# Patient Record
Sex: Female | Born: 1945 | Race: White | Hispanic: No | Marital: Single | Smoking: Never smoker
Health system: Southern US, Community
[De-identification: ages and names within clinical notes are randomized; demographics above are authoritative.]

## PROBLEM LIST (undated history)

## (undated) DIAGNOSIS — M1990 Unspecified osteoarthritis, unspecified site: Secondary | ICD-10-CM

## (undated) DIAGNOSIS — Z9889 Other specified postprocedural states: Secondary | ICD-10-CM

## (undated) DIAGNOSIS — R112 Nausea with vomiting, unspecified: Secondary | ICD-10-CM

## (undated) HISTORY — PX: TONSILLECTOMY: SUR1361

---

## 1983-05-22 HISTORY — PX: DILATION AND CURETTAGE OF UTERUS: SHX78

## 1990-05-21 HISTORY — PX: ABDOMINAL HYSTERECTOMY: SHX81

## 1994-05-21 DIAGNOSIS — C4491 Basal cell carcinoma of skin, unspecified: Secondary | ICD-10-CM

## 1994-05-21 HISTORY — DX: Basal cell carcinoma of skin, unspecified: C44.91

## 1997-11-30 ENCOUNTER — Other Ambulatory Visit: Admission: RE | Admit: 1997-11-30 | Discharge: 1997-11-30 | Payer: Self-pay | Admitting: Obstetrics & Gynecology

## 1999-05-17 ENCOUNTER — Other Ambulatory Visit: Admission: RE | Admit: 1999-05-17 | Discharge: 1999-05-17 | Payer: Self-pay | Admitting: Obstetrics & Gynecology

## 2000-08-14 ENCOUNTER — Other Ambulatory Visit: Admission: RE | Admit: 2000-08-14 | Discharge: 2000-08-14 | Payer: Self-pay | Admitting: Obstetrics & Gynecology

## 2001-12-17 ENCOUNTER — Other Ambulatory Visit: Admission: RE | Admit: 2001-12-17 | Discharge: 2001-12-17 | Payer: Self-pay | Admitting: Obstetrics & Gynecology

## 2003-05-03 ENCOUNTER — Other Ambulatory Visit: Admission: RE | Admit: 2003-05-03 | Discharge: 2003-05-03 | Payer: Self-pay | Admitting: Obstetrics & Gynecology

## 2005-02-01 ENCOUNTER — Other Ambulatory Visit: Admission: RE | Admit: 2005-02-01 | Discharge: 2005-02-01 | Payer: Self-pay | Admitting: Obstetrics & Gynecology

## 2005-02-15 ENCOUNTER — Encounter: Admission: RE | Admit: 2005-02-15 | Discharge: 2005-02-15 | Payer: Self-pay | Admitting: Obstetrics & Gynecology

## 2005-09-11 ENCOUNTER — Encounter: Admission: RE | Admit: 2005-09-11 | Discharge: 2005-09-11 | Payer: Self-pay | Admitting: Obstetrics & Gynecology

## 2006-11-19 HISTORY — PX: JOINT REPLACEMENT: SHX530

## 2006-12-03 ENCOUNTER — Ambulatory Visit (HOSPITAL_COMMUNITY): Admission: RE | Admit: 2006-12-03 | Discharge: 2006-12-04 | Payer: Self-pay | Admitting: Orthopedic Surgery

## 2007-10-23 ENCOUNTER — Ambulatory Visit: Payer: Self-pay | Admitting: Internal Medicine

## 2007-11-07 ENCOUNTER — Ambulatory Visit: Payer: Self-pay | Admitting: Internal Medicine

## 2010-10-03 NOTE — H&P (Signed)
[name], [name]               ACCOUNT NO.:  [id number]   MEDICAL RECORD NO.:  [id number]        PATIENT TYPE:  LINP   LOCATION:                               FACILITY:  [facility]   PHYSICIAN:  Madlyn Frankel. Charlann Boxer, M.D.  DATE OF BIRTH:  20-Nov-1945   DATE OF ADMISSION:  12/03/2006  DATE OF DISCHARGE:                              HISTORY & PHYSICAL   ATTENDING PHYSICIAN:  Durene Romans, M.D.   PROCEDURE TO BE PERFORMED:  Right unicondylar knee arthroplasty.   CHIEF COMPLAINT:  Right knee pain.   HISTORY OF PRESENT ILLNESS:  This is a 65 year old female with a history  of persistent progressive knee pain mainly on the medial side that has  been refractory to all conservative treatments.  She has been  presurgically assessed by Dr. Windy Fast Deal.   PAST MEDICAL HISTORY:  Post menopausal, otherwise healthy.   FAMILY HISTORY:  Heart disease, diabetes, cancer, and arthritis.   SOCIAL HISTORY:  The patient is divorced, a Runner, broadcasting/film/video, primary caregiver  after surgery will be Dr. Delight Hoh and Jacqulyn Liner.   DRUG ALLERGIES:  No known drug allergies.   MEDICATIONS:  Premarin, unknown dosage or delivery route, vitamin D plus  calcium.   REVIEW OF SYSTEMS:  None other than HPI.   HISTORY OF PRESENT ILLNESS:  VITAL SIGNS:  Pulse 64, respirations 18,  and blood pressure 126/92.  GENERAL:  She is awake, alert and oriented, well-developed, well-  nourished, in no acute distress.  NECK:  Supple.  No carotid bruits.  CHEST/LUNGS:  Clear to auscultation bilaterally.  BREASTS:  Deferred.  HEART:  Regular rate and rhythm without gallops, clicks, rubs, or  murmurs.  ABDOMEN:  The abdomen is soft, nontender, nondistended.  Bowel sounds  are present in all four quadrants.  GENITOURINARY:  Deferred.  EXTREMITIES:  She has medial sided tenderness, stable in all four  quadrants, no significant deformity.  SKIN:  The skin is intact.  No scars, dorsalis pedis pulses are  positive.  NEUROLOGIC:   Intact distal sensibilities.   LABORATORY STUDIES:  EKG and chest x-ray are all pending.   IMPRESSION:  1. Osteoarthritis.  2. Post menopausal.   PLAN OF ACTION:  Right unicondylar knee replacement on 12/03/06 at  [facility] by Dr. Durene Romans.  Risks and complications were  discussed.  Questions were encouraged, answered, and reviewed.   We did discuss preoperative use of Celebrex and she was given a  prescription for Celebrex 200 mg which she will take two days before  surgery as well as on the morning of her surgery, dosage of one per day,  twice a day up to the surgery, and two on the morning of surgery.  We  also want to continue these two days postoperatively on postop day  number one and two as well.  Also the patient was given a prescription  for postoperative medications which included her blood thinning,  muscle  relaxers, as well as over-the-counter medications.     ______________________________  Yetta Glassman [address], [place]      Madlyn Frankel. Charlann Boxer, M.D.  Electronically  Signed    BLM/MEDQ  D:  11/21/2006  T:  11/22/2006  Job:  [id number]   cc:   Sharlet Salina [address]. [address], [place]   Madlyn Frankel. Charlann Boxer, M.D.  Fax: [phone]

## 2010-10-03 NOTE — Op Note (Signed)
NAMEJAPJI, KOK               ACCOUNT NO.:  [id number]   MEDICAL RECORD NO.:  [id number]          PATIENT TYPE:  INP   LOCATION:  0007                         FACILITY:  [facility]   PHYSICIAN:  Madlyn Frankel. Charlann Boxer, M.D.  DATE OF BIRTH:  [birth year]   DATE OF PROCEDURE:  12/03/2006  DATE OF DISCHARGE:                               OPERATIVE REPORT   PREOPERATIVE DIAGNOSIS:  Right knee medial compartment osteoarthritis.   POSTOPERATIVE DIAGNOSIS:  Right knee medial compartment osteoarthritis.   PROCEDURE:  Right unicompartmental or partial knee replacement using a  Biomet Oxford knee system small femur, size A right medial tibia and a  size 5 anatomic insert.   SURGEON:  Madlyn Frankel. Charlann Boxer, M.D.   ASSISTANT:  Dwyane Luo, PA-C   ANESTHESIA:  Duramorph spinal.   BLOOD LOSS:  Minimal.   TOURNIQUET TIME:  60 minutes at 250 mmHg.   INDICATIONS FOR PROCEDURE:  Ms. Leard is a 65 year old female who  presented to the office for evaluation of bilateral knee osteoarthritis.  Radiographically she had primary bone-on-bone medial compartment  osteoarthritis.  I reviewed with her the possible indications for total  versus unicompartmental knee replacement as she had failed conservative  measures and had persistent discomfort.   After reviewing the risks and benefits including the possible need for  revision surgery and patellofemoral arthritis, she wished proceed with a  partial knee replacement.  Radiographic analysis indicated that she had  an intact medial collateral ligament, intact anterior cruciate ligament  based on wear pattern.  Consent was obtained after reviewing the  standard risks of infection, DVT, component failure, need for revision  surgery.  Consent obtained.   PROCEDURE IN DETAIL:  The patient was brought to operative theater.  Once adequate anesthesia, preoperative antibiotics, Ancef, were  administered, the patient was positioned supine.  Proximal thigh  tourniquet was  placed, the Oxford leg holder was then positioned  appropriately to allow for 120 degrees of flexion.  The right lower  extremity was pre scrubbed and prepped and draped in sterile fashion.  A  paramidline incision was made from patella to the tubercle region.  The  sharp dissection was carried out and opened up soft tissue planes.  The  modified medial parapatellar arthrotomy was created to allow for patella  subluxation and synovial debridement carried out.   Attention was first directed debrided osteophytes.  This included into  the notch as well as the medial aspect of femur.  The patient noted to  have extensive medial compartment osteoarthritis with bone-on-bone  articulation.  Osteophytes were debrided along the entire medial extent  of the femur and tibia.   Evaluation of patella revealed the trochlear surface was relatively  intact.  There were some grade 2 changes noted on the patella  undersurface but no exposed or eburnated bone regions.   At this point we proceeded with the medial compartment arthroplasty.  The extramedullary guide was placed for tibial cut and after  appropriately positioned, was pinned into place.  Using a reciprocating  saw and made the initial cut towards the center of the hip  joint.  I  then used the oscillating saw to remove the wedge.  The patient's wear  pattern was noted to be perfect for this type of procedure with the  anterior medial pattern.  I sized this cut surface initially with a size  B tibia, however, once I removed some osteophytes the A fit better.   Once I had this A in position, I trialed with my feeler gauge and felt  that there was at least room for the size 4 with good fit.  This was  without retractors placed, so at this point the pins were removed and  attention directed to the femur  The starting awl was passed into the  femur through a starting hole placed a centimeter above the medial  aspect of the notch.     With the  intramedullary rod in the femur and a size 3 feeler gauge in  place, the guide for the posterior cutting jig was placed on the femur.  Once all parameters met and parallel and perpendicular I drilled these  holes.  The posterior femoral cutting block was then placed and the cut  made.  This matched perfectly with size small femur.   At this point I used a zero spigot milled the distal femur.   I then performed a trial with a small femur, A tibia and 4 mm insert and  the knee came out.  In flexion I felt that the 5 was a little bit better  in terms of stability but not excessively tight.  However, when I  brought the knee to 20 degrees of extension, the one feeler gauge felt  good, so I used a size 4 spigot.  The 4 spigot was then placed, milled,  excessive osteophytes and bone removed.  Once the debris was carried  out, I retrialed with a small femur, A tibia and a 5 insert.  I felt the  knee was very stable with a millimeter or two of play with the feeler  gauge in 90 degrees of flexion and 20 degrees of flexion.  This to me  equated to a balanced knee.  At this point all trial components removed.  I drilled the distal femur due to some sclerotic bone.  Final  preparation of the tibia was carried out using with the keeled  component.  Once I used a reciprocating saw to create the notch, I  trialed with a small femur and the keeled tibial component.  I used the  more conforming trial liner and then still felt the size 5 felt great in  90 degrees of flexion and 20 degrees of extension.  At this point all  trials were removed, the knee was irrigated, cement was mixed in two  batches, one for the tibia one for the femur.  Final components were  opened, tibia was cemented in place first with the trial femur placed,  the knee brought to 45 degrees of flexion.  Excessive cement was  removed.  The second batch cement was used for the femur and done in the  same way.  Once all the extruded  cement was removed and cured and I was  happy there was no remaining cement even in the posterior aspect of the  knee.  I injected the knee with 60 mL of 0.25% Marcaine with epinephrine  and Toradol.  The final 5 insert was then placed.  I injected FloSeal  into the starting hole of the femur as well as into  the synovial tissues  surrounding it.  At this point the extensor mechanism was reapproximated  using #1 Vicryl.  The remainder of wound was closed with 2-0 Vicryl and  running 4-0 Monocryl.  The knee was dressed with Steri-Strips and a  silver based dressing.  The patient was brought to recovery room in  stable condition.      Madlyn Frankel Charlann Boxer, M.D.  Electronically Signed     MDO/MEDQ  D:  12/03/2006  T:  12/04/2006  Job:  [id number]

## 2011-03-05 LAB — CBC
HCT: 31.8 — ABNORMAL LOW
Hemoglobin: 11.2 — ABNORMAL LOW
MCHC: 35.4
MCV: 82.4
Platelets: 253
RBC: 3.86 — ABNORMAL LOW
RDW: 12.9
WBC: 8

## 2011-03-05 LAB — TYPE AND SCREEN

## 2011-03-05 LAB — BASIC METABOLIC PANEL
CO2: 28
Creatinine, Ser: 0.53
Glucose, Bld: 120 — ABNORMAL HIGH

## 2011-03-05 LAB — ABO/RH: ABO/RH(D): O POS

## 2011-03-06 LAB — COMPREHENSIVE METABOLIC PANEL
AST: 21
Alkaline Phosphatase: 50
BUN: 15
Calcium: 9.3
Chloride: 108
Creatinine, Ser: 0.7
GFR calc Af Amer: >60
Glucose, Bld: 105 — ABNORMAL HIGH
Sodium: 142
Total Protein: 6.5

## 2011-03-06 LAB — URINALYSIS, ROUTINE W REFLEX MICROSCOPIC
Bilirubin Urine: NEGATIVE
Hgb urine dipstick: NEGATIVE
pH: 7

## 2011-03-06 LAB — CBC
MCHC: 34.6
RDW: 13.3

## 2011-11-02 ENCOUNTER — Encounter (HOSPITAL_COMMUNITY): Payer: Self-pay | Admitting: Pharmacy Technician

## 2011-11-04 NOTE — H&P (Signed)
[name] is an 66 y.o. female.    Chief Complaint: Left knee medial compartment OA and pain   HPI: Pt is a 66 y.o. female complaining of left knee pain for 7+ years.  Previous right knee medial unilateral knee replacement in 11/2006, she has been doing well with this surgery. Pain in the left knee has continually increased since the beginning, but he was trying to put it off as long as she could. It is now effecting her quality of life and wishes to have something done. X-rays in the clinic show end-stage arthritic changes of the medial compartment of the left knee. Pt has tried various conservative treatments which have failed to alleviate their symptoms, including NSAIDs, steroid injection, hyaluronic acid injections and braces. Various options are discussed with the patient. Risks, benefits and expectations were discussed with the patient. Patient understand the risks, benefits and expectations and wishes to proceed with surgery.   PCP:  No primary provider on file.  D/C Plans:  Home with HHPT  Post-op Meds:  Rx given for ASA, Robaxin, Celebrex, Iron, Colace and MiraLax  Tranexamic Acid:   To be given  Decadron:   To be given  PMH: IBS Arthritis  PSH: Right unilateral knee replacement - 2009 Hysterectomy - 1991  Social History:  Denies the use of tobacco Admits to daily use of wine  Allergies:  Allergies  Allergen Reactions  . Penicillins     REACTION: Extreme swelling at injection site    Medications: No current facility-administered medications for this encounter.   Current Outpatient Prescriptions  Medication Sig Dispense Refill  . calcium-vitamin D (OSCAL WITH D) 500-200 MG-UNIT per tablet Take 1 tablet by mouth daily.      . cetirizine (ZYRTEC) 10 MG tablet Take 10 mg by mouth daily.      . cholecalciferol (VITAMIN D) 1000 UNITS tablet Take 1,000 Units by mouth daily.      Marland Kitchen estrogens, conjugated, (PREMARIN) 0.45 MG tablet Take 0.45 mg by mouth daily. Take  daily for 21 days then do not take for 7 days.      . meloxicam (MOBIC) 7.5 MG tablet Take 7.5 mg by mouth daily.      . Multiple Vitamin (MULTIVITAMIN WITH MINERALS) TABS Take 1 tablet by mouth daily.        ROS: Review of Systems  Constitutional: Negative.   HENT: Negative.   Eyes: Negative.   Respiratory: Negative.   Cardiovascular: Negative.   Gastrointestinal: Negative.   Genitourinary: Negative.   Musculoskeletal: Positive for joint pain.  Skin: Negative.   Neurological: Negative.   Endo/Heme/Allergies: Negative.   Psychiatric/Behavioral: Negative.      Physical Exam: BP: 161/85 ; HR: 78 ; Resp: 16 Physical Exam  Constitutional: She is oriented to person, place, and time and well-developed, well-nourished, and in no distress.  HENT:  Head: Normocephalic and atraumatic.  Nose: Nose normal.  Mouth/Throat: Oropharynx is clear and moist.  Eyes: Pupils are equal, round, and reactive to light.  Neck: Neck supple. No JVD present. No tracheal deviation present. No thyromegaly present.  Cardiovascular: Normal rate, regular rhythm, normal heart sounds and intact distal pulses.   Pulmonary/Chest: Effort normal and breath sounds normal. No respiratory distress. She exhibits no tenderness.  Abdominal: Soft. There is no tenderness. There is no guarding.  Musculoskeletal:       Left knee: She exhibits decreased range of motion, swelling and bony tenderness (medial aspect of the knee only). She exhibits no effusion, no  ecchymosis and no laceration. tenderness found.  Lymphadenopathy:    She has no cervical adenopathy.  Neurological: She is alert and oriented to person, place, and time.  Skin: Skin is warm and dry.  Psychiatric: Affect normal.     Assessment/Plan Assessment:   Left knee medial compartment OA and pain    Plan: Patient will undergo a left medial unilateral knee arthroplasty  on 11/19/2011 per Dr. Charlann Boxer at [facility]. Risks benefits and expectation were  discussed with the patient. Patient understand risks, benefits and expectation and wishes to proceed.   Anastasio Auerbach Caisley Baxendale   PAC  11/04/2011, 4:01 PM

## 2011-11-06 ENCOUNTER — Encounter (HOSPITAL_COMMUNITY)
Admission: RE | Admit: 2011-11-06 | Discharge: 2011-11-06 | Disposition: A | Discharge status: Home / Self Care | Payer: Medicare Other | Source: Ambulatory Visit | Attending: Orthopedic Surgery | Admitting: Orthopedic Surgery

## 2011-11-06 ENCOUNTER — Encounter (HOSPITAL_COMMUNITY): Payer: Self-pay

## 2011-11-06 HISTORY — DX: Nausea with vomiting, unspecified: R11.2

## 2011-11-06 HISTORY — DX: Other specified postprocedural states: Z98.890

## 2011-11-06 HISTORY — DX: Unspecified osteoarthritis, unspecified site: M19.90

## 2011-11-06 LAB — BASIC METABOLIC PANEL
GFR calc non Af Amer: >90 mL/min (ref >90)
Glucose, Bld: 94 mg/dL (ref 70–99)
Potassium: 4.1 mEq/L (ref 3.5–5.1)
Sodium: 137 mEq/L (ref 135–145)

## 2011-11-06 LAB — URINE MICROSCOPIC-ADD ON

## 2011-11-06 LAB — URINALYSIS, ROUTINE W REFLEX MICROSCOPIC
Nitrite: NEGATIVE
Specific Gravity, Urine: 1.024 (ref 1.005–1.030)
Urobilinogen, UA: 0.2 mg/dL (ref 0.0–1.0)
pH: 5.5 (ref 5.0–8.0)

## 2011-11-06 LAB — DIFFERENTIAL
Eosinophils Absolute: 0.1 10*3/uL (ref 0.0–0.7)
Lymphocytes Relative: 28 % (ref 12–46)
Lymphs Abs: 1.5 10*3/uL (ref 0.7–4.0)
Monocytes Relative: 7 % (ref 3–12)
Neutro Abs: 3.4 10*3/uL (ref 1.7–7.7)
Neutrophils Relative %: 63 % (ref 43–77)

## 2011-11-06 LAB — CBC
Hemoglobin: 14.3 g/dL (ref 12.0–15.0)
MCH: 28.4 pg (ref 26.0–34.0)
RBC: 5.04 MIL/uL (ref 3.87–5.11)
WBC: 5.4 10*3/uL (ref 4.0–10.5)

## 2011-11-06 LAB — SURGICAL PCR SCREEN: MRSA, PCR: NEGATIVE

## 2011-11-06 LAB — PROTIME-INR: INR: 0.96 (ref 0.00–1.49)

## 2011-11-06 NOTE — Pre-Procedure Instructions (Signed)
Reviewed pre-op instructions with patient using Teach back method. 

## 2011-11-06 NOTE — Patient Instructions (Signed)
19 [name]  11/06/2011   Your procedure is scheduled on:  Monday 11/19/2011 at 0730am  Report to [facility] at 0530 AM.  Call this number if you have problems the morning of surgery: [phone]   Remember:   Do not eat food:After Midnight.  May have clear liquids:until Midnight .    Take these medicines the morning of surgery with A SIP OF WATER: Zyrtec   Do not wear jewelry, make-up or nail polish.  Do not wear lotions, powders, or perfumes.   Do not shave 48 hours prior to surgery. Men may shave face and neck.  Do not bring valuables to the hospital.  Contacts, dentures or bridgework may not be worn into surgery.  Leave suitcase in the car. After surgery it may be brought to your room.  For patients admitted to the hospital, checkout time is 11:00 AM the day of discharge.       Special Instructions: CHG Shower Use Special Wash: 1/2 bottle night before surgery and 1/2 bottle morning of surgery.   Please read over the following fact sheets that you were given: MRSA Information, Blood fact sheet, Incentive spirometry sheet, Sleep apnea sheet

## 2011-11-06 NOTE — Pre-Procedure Instructions (Signed)
Faxed note to Dr. Charlann Boxer about looking at abnormal BMET,U/A and microscopic U/A results.

## 2011-11-19 ENCOUNTER — Encounter (HOSPITAL_COMMUNITY)
Admission: RE | Disposition: A | Discharge status: Home Health | Payer: Self-pay | Source: Ambulatory Visit | Attending: Orthopedic Surgery

## 2011-11-19 ENCOUNTER — Encounter (HOSPITAL_COMMUNITY): Payer: Self-pay | Admitting: *Deleted

## 2011-11-19 ENCOUNTER — Inpatient Hospital Stay (HOSPITAL_COMMUNITY)
Admission: RE | Admit: 2011-11-19 | Discharge: 2011-11-20 | DRG: 470 | Disposition: A | Discharge status: Home Health | Payer: Medicare Other | Source: Ambulatory Visit | Attending: Orthopedic Surgery | Admitting: Orthopedic Surgery

## 2011-11-19 ENCOUNTER — Ambulatory Visit (HOSPITAL_COMMUNITY): Payer: Medicare Other | Admitting: *Deleted

## 2011-11-19 DIAGNOSIS — Z96652 Presence of left artificial knee joint: Secondary | ICD-10-CM

## 2011-11-19 DIAGNOSIS — M1710 Unilateral primary osteoarthritis, unspecified knee: Principal | ICD-10-CM | POA: Diagnosis present

## 2011-11-19 DIAGNOSIS — K589 Irritable bowel syndrome without diarrhea: Secondary | ICD-10-CM | POA: Diagnosis present

## 2011-11-19 DIAGNOSIS — Z96659 Presence of unspecified artificial knee joint: Secondary | ICD-10-CM

## 2011-11-19 DIAGNOSIS — Z01812 Encounter for preprocedural laboratory examination: Secondary | ICD-10-CM

## 2011-11-19 HISTORY — PX: PARTIAL KNEE ARTHROPLASTY: SHX2174

## 2011-11-19 SURGERY — ARTHROPLASTY, KNEE, UNICOMPARTMENTAL
Anesthesia: Spinal | Site: Knee | Laterality: Left | Wound class: Clean

## 2011-11-19 MED ORDER — EPHEDRINE SULFATE 50 MG/ML IJ SOLN
INTRAMUSCULAR | Status: DC | PRN
  Administered 2011-11-19: 5 mg via INTRAVENOUS
  Administered 2011-11-19: 10 mg via INTRAVENOUS
  Administered 2011-11-19 (×4): 5 mg via INTRAVENOUS

## 2011-11-19 MED ORDER — ACETAMINOPHEN 10 MG/ML IV SOLN
INTRAVENOUS | Status: DC | PRN
  Administered 2011-11-19: 1000 mg via INTRAVENOUS

## 2011-11-19 MED ORDER — FERROUS SULFATE 325 (65 FE) MG PO TABS
325.0000 mg | ORAL_TABLET | Freq: Three times a day (TID) | ORAL | Status: DC
  Filled 2011-11-19 (×6): qty 1

## 2011-11-19 MED ORDER — ACETAMINOPHEN 10 MG/ML IV SOLN
INTRAVENOUS | Status: AC
  Filled 2011-11-19: qty 100

## 2011-11-19 MED ORDER — SODIUM CHLORIDE 0.9 % IV SOLN
INTRAVENOUS | Status: DC
  Administered 2011-11-19 – 2011-11-20 (×2): via INTRAVENOUS
  Filled 2011-11-19 (×5): qty 1000

## 2011-11-19 MED ORDER — DEXAMETHASONE SODIUM PHOSPHATE 10 MG/ML IJ SOLN
10.0000 mg | Freq: Once | INTRAMUSCULAR | Status: AC
  Administered 2011-11-20: 10 mg via INTRAVENOUS
  Filled 2011-11-19: qty 1

## 2011-11-19 MED ORDER — PHENOL 1.4 % MT LIQD
1.0000 | OROMUCOSAL | Status: DC | PRN
  Filled 2011-11-19: qty 177

## 2011-11-19 MED ORDER — CHLORHEXIDINE GLUCONATE 4 % EX LIQD
60.0000 mL | Freq: Once | CUTANEOUS | Status: DC
  Filled 2011-11-19: qty 60

## 2011-11-19 MED ORDER — ALUM & MAG HYDROXIDE-SIMETH 200-200-20 MG/5ML PO SUSP: 30.0000 mL | ORAL | Status: DC | PRN

## 2011-11-19 MED ORDER — KETOROLAC TROMETHAMINE 30 MG/ML IJ SOLN
INTRAMUSCULAR | Status: DC | PRN
  Administered 2011-11-19: 30 mg via INTRAVENOUS

## 2011-11-19 MED ORDER — CLINDAMYCIN PHOSPHATE 600 MG/50ML IV SOLN
INTRAVENOUS | Status: DC | PRN
  Administered 2011-11-19: 600 mg via INTRAVENOUS

## 2011-11-19 MED ORDER — ONDANSETRON HCL 4 MG/2ML IJ SOLN
INTRAMUSCULAR | Status: DC | PRN
  Administered 2011-11-19 (×2): 2 mg via INTRAVENOUS

## 2011-11-19 MED ORDER — BISACODYL 5 MG PO TBEC: 5.0000 mg | DELAYED_RELEASE_TABLET | Freq: Every day | ORAL | Status: DC | PRN

## 2011-11-19 MED ORDER — PHENYLEPHRINE HCL 10 MG/ML IJ SOLN
INTRAMUSCULAR | Status: DC | PRN
  Administered 2011-11-19: 40 ug via INTRAVENOUS
  Administered 2011-11-19 (×2): 20 ug via INTRAVENOUS
  Administered 2011-11-19: 40 ug via INTRAVENOUS

## 2011-11-19 MED ORDER — BUPIVACAINE IN DEXTROSE 0.75-8.25 % IT SOLN
INTRATHECAL | Status: DC | PRN
  Administered 2011-11-19: 10 mg via INTRATHECAL

## 2011-11-19 MED ORDER — ONDANSETRON HCL 4 MG/2ML IJ SOLN: 4.0000 mg | Freq: Four times a day (QID) | INTRAMUSCULAR | Status: DC | PRN

## 2011-11-19 MED ORDER — METOCLOPRAMIDE HCL 10 MG PO TABS: 5.0000 mg | ORAL_TABLET | Freq: Three times a day (TID) | ORAL | Status: DC | PRN

## 2011-11-19 MED ORDER — METOCLOPRAMIDE HCL 5 MG/ML IJ SOLN: 5.0000 mg | Freq: Three times a day (TID) | INTRAMUSCULAR | Status: DC | PRN

## 2011-11-19 MED ORDER — TRANEXAMIC ACID 100 MG/ML IV SOLN
15.0000 mg/kg | Freq: Once | INTRAVENOUS | Status: DC
  Filled 2011-11-19: qty 11.09

## 2011-11-19 MED ORDER — DEXAMETHASONE SODIUM PHOSPHATE 4 MG/ML IJ SOLN
INTRAMUSCULAR | Status: DC | PRN
  Administered 2011-11-19: 10 mg via INTRAVENOUS

## 2011-11-19 MED ORDER — LACTATED RINGERS IV SOLN: INTRAVENOUS | Status: DC

## 2011-11-19 MED ORDER — HYDROCODONE-ACETAMINOPHEN 7.5-325 MG PO TABS
1.0000 | ORAL_TABLET | ORAL | Status: DC
  Administered 2011-11-19 (×2): 1 via ORAL
  Administered 2011-11-20 (×2): 2 via ORAL
  Administered 2011-11-20 (×2): 1 via ORAL
  Filled 2011-11-19 (×3): qty 1
  Filled 2011-11-19: qty 2
  Filled 2011-11-19: qty 1
  Filled 2011-11-19: qty 2

## 2011-11-19 MED ORDER — PROPOFOL 10 MG/ML IV EMUL
INTRAVENOUS | Status: DC | PRN
  Administered 2011-11-19: 100 ug/kg/min via INTRAVENOUS

## 2011-11-19 MED ORDER — CELECOXIB 200 MG PO CAPS
200.0000 mg | ORAL_CAPSULE | Freq: Two times a day (BID) | ORAL | Status: DC
  Administered 2011-11-19 – 2011-11-20 (×3): 200 mg via ORAL
  Filled 2011-11-19 (×4): qty 1

## 2011-11-19 MED ORDER — MENTHOL 3 MG MT LOZG
1.0000 | LOZENGE | OROMUCOSAL | Status: DC | PRN
  Filled 2011-11-19: qty 9

## 2011-11-19 MED ORDER — METHOCARBAMOL 500 MG PO TABS: 500.0000 mg | ORAL_TABLET | Freq: Four times a day (QID) | ORAL | Status: DC | PRN

## 2011-11-19 MED ORDER — BUPIVACAINE-EPINEPHRINE PF 0.25-1:200000 % IJ SOLN
INTRAMUSCULAR | Status: AC
  Filled 2011-11-19: qty 60

## 2011-11-19 MED ORDER — LACTATED RINGERS IV SOLN
INTRAVENOUS | Status: DC | PRN
  Administered 2011-11-19 (×3): via INTRAVENOUS

## 2011-11-19 MED ORDER — DIPHENHYDRAMINE HCL 25 MG PO CAPS: 25.0000 mg | ORAL_CAPSULE | Freq: Four times a day (QID) | ORAL | Status: DC | PRN

## 2011-11-19 MED ORDER — DEXAMETHASONE SODIUM PHOSPHATE 10 MG/ML IJ SOLN: 10.0000 mg | Freq: Once | INTRAMUSCULAR | Status: DC

## 2011-11-19 MED ORDER — 0.9 % SODIUM CHLORIDE (POUR BTL) OPTIME
TOPICAL | Status: DC | PRN
  Administered 2011-11-19: 1000 mL

## 2011-11-19 MED ORDER — HYDROMORPHONE HCL PF 1 MG/ML IJ SOLN
0.5000 mg | INTRAMUSCULAR | Status: DC | PRN
  Administered 2011-11-19 (×2): 0.5 mg via INTRAVENOUS
  Filled 2011-11-19: qty 1

## 2011-11-19 MED ORDER — FLEET ENEMA 7-19 GM/118ML RE ENEM: 1.0000 | ENEMA | Freq: Once | RECTAL | Status: AC | PRN

## 2011-11-19 MED ORDER — LORATADINE 10 MG PO TABS
10.0000 mg | ORAL_TABLET | Freq: Every day | ORAL | Status: DC
  Filled 2011-11-19 (×2): qty 1

## 2011-11-19 MED ORDER — HYDROMORPHONE HCL PF 1 MG/ML IJ SOLN: 0.2500 mg | INTRAMUSCULAR | Status: DC | PRN

## 2011-11-19 MED ORDER — BUPIVACAINE-EPINEPHRINE 0.25% -1:200000 IJ SOLN
INTRAMUSCULAR | Status: DC | PRN
  Administered 2011-11-19: 30 mL

## 2011-11-19 MED ORDER — TRANEXAMIC ACID 100 MG/ML IV SOLN
1109.0000 mg | INTRAVENOUS | Status: DC | PRN
  Administered 2011-11-19: 1109 mg via INTRAVENOUS

## 2011-11-19 MED ORDER — DOCUSATE SODIUM 100 MG PO CAPS
100.0000 mg | ORAL_CAPSULE | Freq: Two times a day (BID) | ORAL | Status: DC
  Administered 2011-11-19 – 2011-11-20 (×3): 100 mg via ORAL

## 2011-11-19 MED ORDER — DEXTROSE 5 % IV SOLN
500.0000 mg | Freq: Four times a day (QID) | INTRAVENOUS | Status: DC | PRN
  Administered 2011-11-19: 500 mg via INTRAVENOUS
  Filled 2011-11-19: qty 5

## 2011-11-19 MED ORDER — ONDANSETRON HCL 4 MG PO TABS: 4.0000 mg | ORAL_TABLET | Freq: Four times a day (QID) | ORAL | Status: DC | PRN

## 2011-11-19 MED ORDER — CLINDAMYCIN PHOSPHATE 600 MG/50ML IV SOLN
INTRAVENOUS | Status: AC
  Filled 2011-11-19: qty 50

## 2011-11-19 MED ORDER — CLINDAMYCIN PHOSPHATE 600 MG/50ML IV SOLN
600.0000 mg | Freq: Four times a day (QID) | INTRAVENOUS | Status: AC
  Administered 2011-11-19 (×2): 600 mg via INTRAVENOUS
  Filled 2011-11-19 (×2): qty 50

## 2011-11-19 MED ORDER — RIVAROXABAN 10 MG PO TABS
10.0000 mg | ORAL_TABLET | ORAL | Status: DC
  Administered 2011-11-20: 10 mg via ORAL
  Filled 2011-11-19 (×2): qty 1

## 2011-11-19 MED ORDER — KETOROLAC TROMETHAMINE 30 MG/ML IJ SOLN
INTRAMUSCULAR | Status: AC
  Filled 2011-11-19: qty 1

## 2011-11-19 MED ORDER — MIDAZOLAM HCL 5 MG/5ML IJ SOLN
INTRAMUSCULAR | Status: DC | PRN
  Administered 2011-11-19 (×4): 1 mg via INTRAVENOUS

## 2011-11-19 MED ORDER — POLYETHYLENE GLYCOL 3350 17 G PO PACK
17.0000 g | PACK | Freq: Two times a day (BID) | ORAL | Status: DC
  Administered 2011-11-19 – 2011-11-20 (×2): 17 g via ORAL

## 2011-11-19 MED ORDER — ZOLPIDEM TARTRATE 5 MG PO TABS: 5.0000 mg | ORAL_TABLET | Freq: Every evening | ORAL | Status: DC | PRN

## 2011-11-19 MED ORDER — CLINDAMYCIN PHOSPHATE 600 MG/50ML IV SOLN: 600.0000 mg | INTRAVENOUS | Status: DC

## 2011-11-19 SURGICAL SUPPLY — 50 items
ADH SKN CLS APL DERMABOND .7 (GAUZE/BANDAGES/DRESSINGS) ×1
BAG SPEC THK2 15X12 ZIP CLS (MISCELLANEOUS) ×1
BAG ZIPLOCK 12X15 (MISCELLANEOUS) ×2 IMPLANT
BANDAGE ELASTIC 6 VELCRO ST LF (GAUZE/BANDAGES/DRESSINGS) ×2 IMPLANT
BANDAGE ESMARK 6X9 LF (GAUZE/BANDAGES/DRESSINGS) ×1 IMPLANT
BLADE SAW RECIPROCATING 77.5 (BLADE) ×2 IMPLANT
BLADE SAW SGTL 13.0X1.19X90.0M (BLADE) ×2 IMPLANT
BNDG CMPR 9X6 STRL LF SNTH (GAUZE/BANDAGES/DRESSINGS) ×1
BNDG ESMARK 6X9 LF (GAUZE/BANDAGES/DRESSINGS) ×2
BOWL SMART MIX CTS (DISPOSABLE) ×2 IMPLANT
CEMENT HV SMART SET (Cement) ×2 IMPLANT
CLOTH BEACON ORANGE TIMEOUT ST (SAFETY) ×2 IMPLANT
COVER SURGICAL LIGHT HANDLE (MISCELLANEOUS) ×2 IMPLANT
CUFF TOURN SGL QUICK 34 (TOURNIQUET CUFF) ×2
CUFF TRNQT CYL 34X4X40X1 (TOURNIQUET CUFF) ×1 IMPLANT
DERMABOND ADVANCED (GAUZE/BANDAGES/DRESSINGS) ×1
DERMABOND ADVANCED .7 DNX12 (GAUZE/BANDAGES/DRESSINGS) ×1 IMPLANT
DRAPE EXTREMITY T 121X128X90 (DRAPE) ×2 IMPLANT
DRAPE POUCH INSTRU U-SHP 10X18 (DRAPES) ×2 IMPLANT
DRSG AQUACEL AG ADV 3.5X 6 (GAUZE/BANDAGES/DRESSINGS) ×2 IMPLANT
DRSG TEGADERM 4X4.75 (GAUZE/BANDAGES/DRESSINGS) ×2 IMPLANT
DURAPREP 26ML APPLICATOR (WOUND CARE) ×2 IMPLANT
ELECT REM PT RETURN 9FT ADLT (ELECTROSURGICAL) ×2
ELECTRODE REM PT RTRN 9FT ADLT (ELECTROSURGICAL) ×1 IMPLANT
EVACUATOR 1/8 PVC DRAIN (DRAIN) ×2 IMPLANT
FACESHIELD LNG OPTICON STERILE (SAFETY) ×8 IMPLANT
GAUZE SPONGE 2X2 8PLY STRL LF (GAUZE/BANDAGES/DRESSINGS) ×1 IMPLANT
GLOVE BIOGEL PI IND STRL 7.5 (GLOVE) ×1 IMPLANT
GLOVE BIOGEL PI IND STRL 8 (GLOVE) IMPLANT
GLOVE BIOGEL PI INDICATOR 7.5 (GLOVE) ×1
GLOVE BIOGEL PI INDICATOR 8 (GLOVE)
GLOVE ORTHO TXT STRL SZ7.5 (GLOVE) ×4 IMPLANT
GOWN BRE IMP PREV XXLGXLNG (GOWN DISPOSABLE) ×4 IMPLANT
GOWN STRL NON-REIN LRG LVL3 (GOWN DISPOSABLE) ×2 IMPLANT
KIT BASIN OR (CUSTOM PROCEDURE TRAY) ×2 IMPLANT
LEGGING LITHOTOMY PAIR STRL (DRAPES) ×2 IMPLANT
MANIFOLD NEPTUNE II (INSTRUMENTS) ×2 IMPLANT
NDL SAFETY ECLIPSE 18X1.5 (NEEDLE) ×1 IMPLANT
NEEDLE HYPO 18GX1.5 SHARP (NEEDLE) ×2
PACK TOTAL JOINT (CUSTOM PROCEDURE TRAY) ×2 IMPLANT
POSITIONER SURGICAL ARM (MISCELLANEOUS) ×2 IMPLANT
SPONGE GAUZE 2X2 STER 10/PKG (GAUZE/BANDAGES/DRESSINGS) ×1
SUCTION FRAZIER TIP 10 FR DISP (SUCTIONS) ×2 IMPLANT
SUT MNCRL AB 4-0 PS2 18 (SUTURE) ×2 IMPLANT
SUT VIC AB 1 CT1 36 (SUTURE) ×4 IMPLANT
SUT VIC AB 2-0 CT1 27 (SUTURE) ×4
SUT VIC AB 2-0 CT1 TAPERPNT 27 (SUTURE) ×2 IMPLANT
SYR 50ML LL SCALE MARK (SYRINGE) ×2 IMPLANT
TOWEL OR 17X26 10 PK STRL BLUE (TOWEL DISPOSABLE) ×4 IMPLANT
TRAY FOLEY CATH 14FRSI W/METER (CATHETERS) ×2 IMPLANT

## 2011-11-19 NOTE — Progress Notes (Signed)
Utilization review completed.  

## 2011-11-19 NOTE — Anesthesia Preprocedure Evaluation (Signed)
Anesthesia Evaluation  Patient identified by MRN, date of birth, ID band Patient awake    Reviewed: Allergy & Precautions, H&P , NPO status , Patient's Chart, lab work & pertinent test results, reviewed documented beta blocker date and time   History of Anesthesia Complications (+) PONV  Airway Mallampati: II TM Distance: >3 FB     Dental  (+) Teeth Intact and Dental Advisory Given   Pulmonary neg pulmonary ROS,  breath sounds clear to auscultation        Cardiovascular negative cardio ROS  Rhythm:Regular Rate:Normal  Denies cardiac symptoms   Neuro/Psych negative neurological ROS  negative psych ROS   GI/Hepatic negative GI ROS, Neg liver ROS,   Endo/Other  negative endocrine ROS  Renal/GU negative Renal ROS  negative genitourinary   Musculoskeletal negative musculoskeletal ROS (+)   Abdominal   Peds negative pediatric ROS (+)  Hematology negative hematology ROS (+)   Anesthesia Other Findings   Reproductive/Obstetrics negative OB ROS                           Anesthesia Physical Anesthesia Plan  ASA: II  Anesthesia Plan: Spinal   Post-op Pain Management:    Induction: Intravenous  Airway Management Planned: Mask  Additional Equipment:   Intra-op Plan:   Post-operative Plan:   Informed Consent:   Plan Discussed with: CRNA and Surgeon  Anesthesia Plan Comments:         Anesthesia Quick Evaluation

## 2011-11-19 NOTE — Interval H&P Note (Signed)
History and Physical Interval Note:  11/19/2011 7:38 AM  [name]  has presented today for surgery, with the diagnosis of Medial Compartamental Left Knee Osteoarthritis  The various methods of treatment have been discussed with the patient and family. After consideration of risks, benefits and other options for treatment, the patient has consented to  Procedure(s) (LRB): LEFT partial knee repalcement UNICOMPARTMENTAL KNEE (Left) as a surgical intervention .  The patient's history has been reviewed, patient examined, no change in status, stable for surgery.  I have reviewed the patients' chart and labs.  Questions were answered to the patient's satisfaction.     Shelda Pal

## 2011-11-19 NOTE — Preoperative (Signed)
Beta Blockers   Reason not to administer Beta Blockers:Not Applicable 

## 2011-11-19 NOTE — Op Note (Signed)
NAME: [name]    MEDICAL RECORD NO.: [id number]   FACILITY: [facility]   DATE OF BIRTH: November 04, 1945  PHYSICIAN: Madlyn Frankel. Charlann Boxer, M.D.    DATE OF PROCEDURE: 11/19/2011    OPERATIVE REPORT   PREOPERATIVE DIAGNOSIS: Left knee medial compartment osteoarthritis.   POSTOPERATIVE DIAGNOSIS: Left knee medial compartment osteoarthritis.  PROCEDURE: Left partial knee replacement utilizing Biomet Oxford knee  component, size small femur, a left medial size AA tibial tray with a size 4 insert.   SURGEON: Madlyn Frankel. Charlann Boxer, M.D.   ASSISTANT: Lanney Gins, PAC.  Please note that Mr. Carmon Sails was present for the entirety of the case,  utilized for preoperative positioning, perioperative retractor  management, general facilitation of the case and primary wound closure.   ANESTHESIA: Spinal plus MAC.   SPECIMENS: None.   COMPLICATIONS: None.  DRAINS: 1 medium HV   TOURNIQUET TIME: 49 minutes at 250 mmHg.   INDICATIONS FOR PROCEDURE: The patient is a 43 female patient of mine who presented for evaluation of left knee pain.  She presented with primary complaints of pain on the medial side of their knee. Radiographs revealed advanced medial compartment arthritis with specifically an antero-medial wear pattern.  There was bone on bone changes noted with subchondral sclerosis and osteophytes present. The patient has had progressive problems failing to respond to conservative measures of medications, injections and activity modification.  She had previously had her right knee replaced with similar implant.  Thus risks of infection, DVT, component failure, need for future revision surgery were all discussed and reviewed.  Consent was obtained for benefit of pain relief.   PROCEDURE IN DETAIL: The patient was brought to the operative theater.  Once adequate anesthesia, preoperative antibiotics, 600mg  Clindamycin administered, the patient was positioned in supine position with a left thigh tourniquet    placed. The left lower extremity was prepped and draped in sterile  fashion with the leg on the Oxford leg holder.  The leg was allowed to flex to 120 degrees. A time-out  was performed identifying the patient, planned procedure, and extremity.  The leg was exsanguinated, tourniquet elevated to 250 mmHg. A midline  incision was made from the proximal pole of the patella to the tibial tubercle. A  soft tissue plane was created and partial median arthrotomy was then  made to allow for subluxation of the patella. Following initial synovectomy and  debridement, the osteophytes were removed off the medial aspect of the  knee.   Attention was first directed to the tibia. The tibial  extramedullary guide was positioned over the anterior crest of the tibia  and pinned into position, and using a measured resection guide from the  Oxford system, a 4 mm resection was made off the proximal tibia. First  the reciprocating saw along the medial aspect of the tibial spines, then the oscillating saw.    At this point, I sized this cut surface seem to be best fit for a size AA tibial tray.  With the retractors out of the wound and the knee held at 90 degrees the 4 feeler gauge had appropriate tension on the medial ligament.   At this point, the femoral canal was opened with a drill and the  intramedullary rod passed. Then using the guide for a small resection off  the posterior aspect of the femur was positioned over the mid portion of the medial femoral condyle.  The orientation was set using the guide that mates the femoral guide to the intramedullary rod.  The 2 drill holes were made into the distal femur.  The posterior guide was then impacted into place and the posterior  femoral cut made.  At this point, I milled the distal femur with a size 4 spigot in place. At this point, we did a trial reduction of the small femur, size AA tibial tray and a 4 feeler gauge. At 90 degrees of  flexion and at 20  degrees of flexion the knee had symmetric tension on  the ligaments.   Given these findings, the trial femoral component was removed. Final preparation of tibia was carried out by pinning it in position. Then  using a reciprocating saw I removed bone for the keel. Further bone was  removed with an osteotome.  Trial reduction was now carried out with the small femur, the left AA keeled tibia, and a 4 lollipop insert. The balance of the  ligaments appeared to be symmetric at 20 degrees and 90 degrees. Given  all these findings, the trial components were removed.   Cement was mixed. The final components were opened. The knee was irrigated with  normal saline solution. Then final debridements of the  soft tissue was carried out, I also drilled the sclerotic bone with a drill.  The final components were cemented with a single batch of cement in a  two-stage technique with the tibial component cemented first. The knee  was then brought  to 45 degrees of flexion with a 4 feeler gauge, held with pressure for a minute and half.  After this the femoral component was cemented in place.  The knee was again held at 45 degrees of flexion while the cement fully cured.  Excess cement was removed throughout the knee. Tourniquet was let down  after 49 minutes. After the cement had fully cured and excessive cement  was removed throughout the knee there was no visualized cement present.   The final size 4 insert was chosen and snapped into position. We re-irrigated  the knee. I placed a medium Hemovac drain deep. The extensor mechanism  was then reapproximated using a #1 Vicryl with the knee in flexion. The  remaining wound was closed with 2-0 Vicryl and a running 4-0 Monocryl.  The knee was cleaned, dried, and dressed sterilely using Dermabond and  Aquacel dressing. The drain site was dressed separately. The patient  was brought to the recovery room, Ace wrap in place, tolerating the  procedure well. He  will be in the hospital for overnight observation.  We will initiate physical therapy and progress to ambulate.     Madlyn Frankel Charlann Boxer, M.D.

## 2011-11-19 NOTE — Anesthesia Postprocedure Evaluation (Signed)
  Anesthesia Post-op Note  Patient: [name]  Procedure(s) Performed: Procedure(s) (LRB): UNICOMPARTMENTAL KNEE (Left)  Patient Location: PACU  Anesthesia Type: Spinal  Level of Consciousness: oriented and sedated  Airway and Oxygen Therapy: Patient Spontanous Breathing and Patient connected to nasal cannula oxygen  Post-op Pain: none  Post-op Assessment: Post-op Vital signs reviewed, Patient's Cardiovascular Status Stable, Respiratory Function Stable and Patent Airway  Post-op Vital Signs: stable  Complications: No apparent anesthesia complications

## 2011-11-19 NOTE — Anesthesia Procedure Notes (Signed)
Spinal  Patient location during procedure: OR Start time: 11/19/2011 7:45 AM End time: 11/19/2011 7:55 AM Staffing Anesthesiologist: Lestine Box B Performed by: anesthesiologist  Preanesthetic Checklist Completed: patient identified, site marked, surgical consent, pre-op evaluation, timeout performed, IV checked, risks and benefits discussed and monitors and equipment checked Spinal Block Patient position: right lateral decubitus Prep: Betadine and site prepped and draped Patient monitoring: heart rate, cardiac monitor, continuous pulse ox and blood pressure Approach: right paramedian Location: L3-4 Needle Needle type: Quincke  Needle gauge: 25 G Needle length: 10 cm Needle insertion depth: 4 cm Assessment Sensory level: T6 Additional Notes [id number], 2014-09

## 2011-11-19 NOTE — Transfer of Care (Signed)
Immediate Anesthesia Transfer of Care Note  Patient: [name]  Procedure(s) Performed: Procedure(s) (LRB): UNICOMPARTMENTAL KNEE (Left)  Patient Location: PACU  Anesthesia Type: Spinal  Level of Consciousness: awake, alert  and oriented  Airway & Oxygen Therapy: Patient Spontanous Breathing and Patient connected to face mask oxygen  Post-op Assessment: Report given to PACU RN and Post -op Vital signs reviewed and stable  Post vital signs: Reviewed and stable  Complications: No apparent anesthesia complications

## 2011-11-20 LAB — BASIC METABOLIC PANEL
BUN: 11 mg/dL (ref 6–23)
CO2: 24 mEq/L (ref 19–32)
Calcium: 8.5 mg/dL (ref 8.4–10.5)
Chloride: 110 mEq/L (ref 96–112)
Creatinine, Ser: 0.6 mg/dL (ref 0.50–1.10)

## 2011-11-20 LAB — CBC
HCT: 35.9 % — ABNORMAL LOW (ref 36.0–46.0)
MCH: 28.1 pg (ref 26.0–34.0)
MCV: 84.9 fL (ref 78.0–100.0)
Platelets: 257 10*3/uL (ref 150–400)
RBC: 4.23 MIL/uL (ref 3.87–5.11)
WBC: 9 10*3/uL (ref 4.0–10.5)

## 2011-11-20 MED ORDER — METHOCARBAMOL 500 MG PO TABS: 500.0000 mg | ORAL_TABLET | Freq: Four times a day (QID) | ORAL | Status: AC | PRN

## 2011-11-20 MED ORDER — POLYETHYLENE GLYCOL 3350 17 G PO PACK: 17.0000 g | PACK | Freq: Two times a day (BID) | ORAL | Status: AC

## 2011-11-20 MED ORDER — FERROUS SULFATE 325 (65 FE) MG PO TABS: 325.0000 mg | ORAL_TABLET | Freq: Three times a day (TID) | ORAL | Status: AC

## 2011-11-20 MED ORDER — DIPHENHYDRAMINE HCL 25 MG PO CAPS: 25.0000 mg | ORAL_CAPSULE | Freq: Four times a day (QID) | ORAL | Status: AC | PRN

## 2011-11-20 MED ORDER — CELECOXIB 200 MG PO CAPS: 200.0000 mg | ORAL_CAPSULE | Freq: Two times a day (BID) | ORAL | Status: AC

## 2011-11-20 MED ORDER — ASPIRIN EC 325 MG PO TBEC: 325.0000 mg | DELAYED_RELEASE_TABLET | Freq: Two times a day (BID) | ORAL | Status: AC

## 2011-11-20 MED ORDER — DSS 100 MG PO CAPS: 100.0000 mg | ORAL_CAPSULE | Freq: Two times a day (BID) | ORAL | Status: AC

## 2011-11-20 MED ORDER — HYDROCODONE-ACETAMINOPHEN 7.5-325 MG PO TABS: 1.0000 | ORAL_TABLET | ORAL | Status: AC | PRN

## 2011-11-20 NOTE — Evaluation (Signed)
Physical Therapy Evaluation Patient Details Name: [name] MRN: [id number] DOB: 1945/10/31 Today's Date: 11/20/2011 Time: 1191-4782 PT Time Calculation (min): 31 min  PT Assessment / Plan / Recommendation Clinical Impression  pt is s/p L UKR and will benefit from PT to maximize independence for home setting    PT Assessment  Patient needs continued PT services    Follow Up Recommendations  Home health PT    Barriers to Discharge        Equipment Recommendations   (RW--TBA)    Recommendations for Other Services     Frequency 7X/week    Precautions / Restrictions Precautions Precautions: None   Pertinent Vitals/Pain       Mobility  Bed Mobility Bed Mobility: Supine to Sit Supine to Sit: 5: Supervision Transfers Transfers: Sit to Stand;Stand to Sit Sit to Stand: 5: Supervision Stand to Sit: 5: Supervision Details for Transfer Assistance: cues for hand placement and safety Ambulation/Gait Ambulation/Gait Assistance: 4: Min guard Ambulation Distance (Feet): 160 Feet Assistive device: Rolling walker Gait Pattern: Step-to pattern;Step-through pattern;Decreased stance time - left General Gait Details: cues for step length and Rw position from self    Exercises Total Joint Exercises Ankle Circles/Pumps: AROM;Both;20 reps Quad Sets: AROM;Both;10 reps Heel Slides: AROM;Left;10 reps   PT Diagnosis: Difficulty walking  PT Problem List: Decreased strength;Decreased range of motion;Decreased activity tolerance;Decreased balance;Decreased mobility;Pain;Decreased knowledge of use of DME PT Treatment Interventions: DME instruction;Gait training;Stair training;Functional mobility training;Therapeutic activities;Therapeutic exercise;Patient/family education   PT Goals Acute Rehab PT Goals PT Goal Formulation: With patient Time For Goal Achievement: 11/22/11 Potential to Achieve Goals: Good Pt will Ambulate: 16 - 50 feet;with modified independence;with least restrictive  assistive device PT Goal: Ambulate - Progress: Goal set today Pt will Go Up / Down Stairs: 6-9 stairs;with min assist;with least restrictive assistive device;with rail(s) PT Goal: Up/Down Stairs - Progress: Goal set today Pt will Perform Home Exercise Program: with supervision, verbal cues required/provided PT Goal: Perform Home Exercise Program - Progress: Goal set today  Visit Information  Last PT Received On: 11/20/11 Assistance Needed: +1    Subjective Data  Subjective: I just had my meds about ago Patient Stated Goal: home   Prior Functioning  Home Living Lives With: Spouse Available Help at Discharge: Family Type of Home: House Home Access: Stairs to enter [employer] of Steps: 2-3 Entrance Stairs-Rails: None Home Layout: Bed/bath upstairs;Two level Alternate Level Stairs-Number of Steps: 12 Alternate Level Stairs-Rails: Right Prior Function Level of Independence: Independent Able to Take Stairs?: Yes Driving: Yes    Cognition  Overall Cognitive Status: Appears within functional limits for tasks assessed/performed Arousal/Alertness: Awake/alert Orientation Level: Appears intact for tasks assessed Behavior During Session: [facility] for tasks performed    Extremity/Trunk Assessment Right Upper Extremity Assessment RUE ROM/Strength/Tone: Within functional levels Left Upper Extremity Assessment LUE ROM/Strength/Tone: Within functional levels Right Lower Extremity Assessment RLE ROM/Strength/Tone: Within functional levels Left Lower Extremity Assessment LLE ROM/Strength/Tone: Deficits LLE ROM/Strength/Tone Deficits: able to do SLR, flexion grossly 10* to 65* actively   Balance    End of Session PT - End of Session Equipment Utilized During Treatment: Gait belt Activity Tolerance: Patient tolerated treatment well Patient left: in chair  GP     [facility] 11/20/2011, 9:35 AM

## 2011-11-20 NOTE — Care Management Note (Signed)
    Page 1 of 2   11/20/2011     5:59:40 PM   CARE MANAGEMENT NOTE 11/20/2011  Patient:  [name], [name]   Account Number:  [id number]  Date Initiated:  11/20/2011  Documentation initiated by:  Colleen Can  Subjective/Objective Assessment:   dx medial compartmental left knee osteoarthritis; partial knee replacemnt     Action/Plan:   CM spoke with patient. Plans are for patient to return to her home in [place] whee  daughter will nbe caregiver. Wants [facility] for hh services . Already has RW, but will possible need cane. Genevieve Norlander will provide HHpt and needed dme   Anticipated DC Date:  11/20/2011   Anticipated DC Plan:  HOME W HOME HEALTH SERVICES  In-house referral  NA      DC Planning Services  CM consult      [facility] Choice  HOME HEALTH   Choice offered to / List presented to:  C-1 Patient   DME arranged  NA      DME agency  NA     HH arranged  HH-2 PT      [facility] agency  [facility]   Status of service:  Completed, signed off Medicare Important Message given?  NA - LOS <3 / Initial given by admissions (If response is "NO", the following Medicare IM given date fields will be blank) Date Medicare IM given:   Date Additional Medicare IM given:    Discharge Disposition:  HOME W HOME HEALTH SERVICES  Per UR Regulation:    If discussed at Long Length of Stay Meetings, dates discussed:    Comments:  11/20/2011 Raynelle Bring BSN CCM Genevieve Norlander will start [facility] services tomorrow 11/21/2011

## 2011-11-20 NOTE — Progress Notes (Signed)
11/20/11 1200  PT Visit Information  Last PT Received On 11/20/11  PT Time Calculation  PT Start Time 1202  PT Stop Time 1229  PT Time Calculation (min) 27 min  Subjective Data  Subjective It is not bad  Precautions  Precautions Knee  Cognition  Overall Cognitive Status Appears within functional limits for tasks assessed/performed  Arousal/Alertness Awake/alert  Orientation Level Appears intact for tasks assessed  Behavior During Session [facility] for tasks performed  Transfers  Transfers Sit to Stand;Stand to Sit  Sit to Stand 5: Supervision;6: Modified independent (Device/Increase time)  Stand to Sit 6: Modified independent (Device/Increase time);5: Supervision  Ambulation/Gait  Ambulation/Gait Assistance 5: Supervision;6: Modified independent (Device/Increase time)  Ambulation Distance (Feet) 375 Feet  Assistive device Rolling walker;Straight cane  Ambulation/Gait Assistance Details cues for sequence  Gait Pattern Step-through pattern;Step-to pattern  General Gait Details discussed RW vs initial use of cane; cautioned pt that RW may be best initially depending on soreness and muscle fatigue; pt agrees; contacted Debbie with [place] for [employer] Yes  Stairs Assistance 4: Min guard  Stair Management Technique One rail Right;With cane  Number of Stairs 6   Total Joint Exercises  Heel Slides AROM;Left;10 reps  PT - End of Session  Activity Tolerance Patient tolerated treatment well  Patient left in chair;with call bell/phone within reach  Nurse Communication Other (comment) (ready for D/C)  PT - Assessment/Plan  Comments on Treatment Session pt doing well.  PT Plan Discharge plan remains appropriate;Frequency remains appropriate  PT Frequency 7X/week  Follow Up Recommendations Home health PT  Equipment Recommended Rolling walker with 5" wheels  Acute Rehab PT Goals  Time For Goal Achievement 11/22/11  Potential to Achieve Goals Good  Pt will Ambulate 16 - 50 feet;with  modified independence;with least restrictive assistive device  PT Goal: Ambulate - Progress Met  Pt will Go Up / Down Stairs 6-9 stairs;with min assist;with least restrictive assistive device;with rail(s)  PT Goal: Up/Down Stairs - Progress Met  PT General Charges  $$ ACUTE PT VISIT 1 Procedure  PT Treatments  $Gait Training 23-37 mins

## 2011-11-20 NOTE — Progress Notes (Signed)
OT Note:  Pt screened for OT.  She has had previous knee surgery and has help at home.  She feels that she will be able to get up from standard commode without DME.  [place], OTR/L [phone] 11/20/2011

## 2011-11-20 NOTE — Progress Notes (Signed)
  Subjective: 1 Day Post-Op Procedure(s) (LRB): UNICOMPARTMENTAL KNEE (Left)   Patient reports pain as mild, pain well controlled. No events throughout the night. Doing much better after this surgery than her last. Ready to be discharged home.  Objective:   VITALS:   Filed Vitals:   11/20/11 0536  BP: 102/69  Pulse: 65  Temp: 97.7 F (36.5 C)  Resp: 16    Neurovascular intact Dorsiflexion/Plantar flexion intact Incision: dressing C/D/I No cellulitis present Compartment soft  LABS  Basename 11/20/11 0400  HGB 11.9*  HCT 35.9*  WBC 9.0  PLT 257     Basename 11/20/11 0400  NA 140  K 3.8  BUN 11  CREATININE 0.60  GLUCOSE 113*     Assessment/Plan: 1 Day Post-Op Procedure(s) (LRB): UNICOMPARTMENTAL KNEE (Left)   Advance diet Up with therapy D/C IV fluids Discharge home with home health Follow up in 2 weeks at [facility].  Follow-up Information    Follow up with Holland,Nina Pinard D in 2 weeks.   Contact information:   [facility] [address] [place] [address] [phone]          Anastasio Auerbach. Diem Pagnotta   PAC  11/20/2011, 8:19 AM

## 2011-11-20 NOTE — Discharge Summary (Signed)
Physician Discharge Summary  Patient ID: [name] MRN: [id number] DOB/AGE: March 01, 1946 66 y.o.  Admit date: 11/19/2011 Discharge date: 11/20/2011  Procedures:  Procedure(s) (LRB): UNICOMPARTMENTAL KNEE (Left)  Attending Physician:  Dr. Durene Romans   Admission Diagnoses:  Left knee medial compartment OA and pain   Discharge Diagnoses:  Principal Problem:  *S/P left UKR IBS  Arthritis  HPI: Pt is a 66 y.o. female complaining of left knee pain for 7+ years. Previous right knee medial unilateral knee replacement in 11/2006, she has been doing well with this surgery. Pain in the left knee has continually increased since the beginning, but he was trying to put it off as long as she could. It is now effecting her quality of life and wishes to have something done. X-rays in the clinic show end-stage arthritic changes of the medial compartment of the left knee. Pt has tried various conservative treatments which have failed to alleviate their symptoms, including NSAIDs, steroid injection, hyaluronic acid injections and braces. Various options are discussed with the patient. Risks, benefits and expectations were discussed with the patient. Patient understand the risks, benefits and expectations and wishes to proceed with surgery.   PCP: Mickie Hillier, MD   Discharged Condition: good  Hospital Course:  Patient underwent the above stated procedure on 11/19/2011. Patient tolerated the procedure well and brought to the recovery room in good condition and subsequently to the floor.  POD #1 BP: 102/69 ; Pulse: 65 ; Temp: 97.7 F (36.5 C) ; Resp: 16  Pt's foley was removed, as well as the hemovac drain removed. IV was changed to a saline lock. Patient reports pain as mild, pain well controlled. No events throughout the night. Doing much better after this surgery than her last. Ready to be discharged home. Neurovascular intact, dorsiflexion/plantar flexion intact, incision: dressing C/D/I, no  cellulitis present and compartment soft.   LABS  Basename  11/20/11 0400   HGB  11.9  HCT  35.9    Discharge Exam: General appearance: alert, cooperative and no distress Extremities: Homans sign is negative, no sign of DVT, no edema, redness or tenderness in the calves or thighs and no ulcers, gangrene or trophic changes  Disposition:  Home with follow up in 2 weeks   Follow-up Information    Follow up with Shelda Pal, MD. Schedule an appointment as soon as possible for a visit in 2 weeks.   Contact information:   [facility] [address] [place] [address] [phone]          Discharge Orders    Future Orders Please Complete By Expires   Diet - low sodium heart healthy      Call MD / Call 911      Comments:   If you experience chest pain or shortness of breath, CALL 911 and be transported to the hospital emergency room.  If you develope a fever above 101 F, pus (white drainage) or increased drainage or redness at the wound, or calf pain, call your surgeon's office.   Discharge instructions      Comments:   Maintain surgical dressing for 8 days, then replace with gauze and tape. Keep the area dry and clean until follow up. Follow up in 2 weeks at [facility]. Call with any questions or concerns.   Constipation Prevention      Comments:   Drink plenty of fluids.  Prune juice may be helpful.  You may use a stool softener, such as  Colace (over the counter) 100 mg twice a day.  Use MiraLax (over the counter) for constipation as needed.   Increase activity slowly as tolerated      Driving restrictions      Comments:   No driving for 4 weeks   TED hose      Comments:   Use stockings (TED hose) for 2 weeks on both leg(s).  You may remove them at night for sleeping.   Change dressing      Comments:   Maintain surgical dressing for 8 days, then change the dressing daily with sterile 4 x 4 inch gauze dressing and  tape. Keep the area dry and clean.      Current Discharge Medication List    START taking these medications   Details  aspirin EC 325 MG tablet Take 1 tablet (325 mg total) by mouth 2 (two) times daily. X 4 weeks Qty: 60 tablet, Refills: 0    celecoxib (CELEBREX) 200 MG capsule Take 1 capsule (200 mg total) by mouth every 12 (twelve) hours.    diphenhydrAMINE (BENADRYL) 25 mg capsule Take 1 capsule (25 mg total) by mouth every 6 (six) hours as needed for itching, allergies or sleep. Qty: 30 capsule    docusate sodium 100 MG CAPS Take 100 mg by mouth 2 (two) times daily. Qty: 10 capsule    ferrous sulfate 325 (65 FE) MG tablet Take 1 tablet (325 mg total) by mouth 3 (three) times daily after meals.    HYDROcodone-acetaminophen (NORCO) 7.5-325 MG per tablet Take 1-2 tablets by mouth every 4 (four) hours as needed for pain. Qty: 120 tablet, Refills: 0    methocarbamol (ROBAXIN) 500 MG tablet Take 1 tablet (500 mg total) by mouth every 6 (six) hours as needed.    polyethylene glycol (MIRALAX / GLYCOLAX) packet Take 17 g by mouth 2 (two) times daily. Qty: 14 each      CONTINUE these medications which have NOT CHANGED   Details  calcium-vitamin D (OSCAL WITH D) 500-200 MG-UNIT per tablet Take 1 tablet by mouth daily.    cetirizine (ZYRTEC) 10 MG tablet Take 10 mg by mouth daily.    cholecalciferol (VITAMIN D) 1000 UNITS tablet Take 1,000 Units by mouth daily.    Multiple Vitamin (MULTIVITAMIN WITH MINERALS) TABS Take 1 tablet by mouth daily.    estrogens, conjugated, (PREMARIN) 0.45 MG tablet Take 0.45 mg by mouth daily. Take daily for 21 days then do not take for 7 days.      STOP taking these medications     meloxicam (MOBIC) 7.5 MG tablet Comments:  Reason for Stopping:           Signed: Anastasio Auerbach. Refael Fulop   PAC  11/20/2011, 8:24 AM

## 2011-11-26 ENCOUNTER — Encounter (HOSPITAL_COMMUNITY): Payer: Self-pay | Admitting: Orthopedic Surgery

## 2012-01-08 ENCOUNTER — Other Ambulatory Visit: Payer: Self-pay | Admitting: Family Medicine

## 2012-01-08 DIAGNOSIS — M799 Soft tissue disorder, unspecified: Secondary | ICD-10-CM

## 2012-01-11 ENCOUNTER — Ambulatory Visit
Admission: RE | Admit: 2012-01-11 | Discharge: 2012-01-11 | Disposition: A | Discharge status: Home / Self Care | Payer: Medicare Other | Source: Ambulatory Visit | Attending: Family Medicine | Admitting: Family Medicine

## 2012-01-11 DIAGNOSIS — M799 Soft tissue disorder, unspecified: Secondary | ICD-10-CM

## 2012-10-24 ENCOUNTER — Encounter (HOSPITAL_BASED_OUTPATIENT_CLINIC_OR_DEPARTMENT_OTHER): Payer: Self-pay | Admitting: Student

## 2012-10-24 ENCOUNTER — Emergency Department (HOSPITAL_BASED_OUTPATIENT_CLINIC_OR_DEPARTMENT_OTHER): Payer: Medicare Other

## 2012-10-24 ENCOUNTER — Emergency Department (HOSPITAL_BASED_OUTPATIENT_CLINIC_OR_DEPARTMENT_OTHER)
Admission: EM | Admit: 2012-10-24 | Discharge: 2012-10-24 | Disposition: A | Discharge status: Home / Self Care | Payer: Medicare Other | Attending: Emergency Medicine | Admitting: Emergency Medicine

## 2012-10-24 DIAGNOSIS — M129 Arthropathy, unspecified: Secondary | ICD-10-CM | POA: Insufficient documentation

## 2012-10-24 DIAGNOSIS — S52502A Unspecified fracture of the lower end of left radius, initial encounter for closed fracture: Secondary | ICD-10-CM

## 2012-10-24 DIAGNOSIS — R296 Repeated falls: Secondary | ICD-10-CM | POA: Insufficient documentation

## 2012-10-24 DIAGNOSIS — Z79899 Other long term (current) drug therapy: Secondary | ICD-10-CM | POA: Insufficient documentation

## 2012-10-24 DIAGNOSIS — Z880 Allergy status to penicillin: Secondary | ICD-10-CM | POA: Insufficient documentation

## 2012-10-24 DIAGNOSIS — Y9239 Other specified sports and athletic area as the place of occurrence of the external cause: Secondary | ICD-10-CM | POA: Insufficient documentation

## 2012-10-24 DIAGNOSIS — Y9373 Activity, racquet and hand sports: Secondary | ICD-10-CM | POA: Insufficient documentation

## 2012-10-24 DIAGNOSIS — S52599A Other fractures of lower end of unspecified radius, initial encounter for closed fracture: Secondary | ICD-10-CM | POA: Insufficient documentation

## 2012-10-24 MED ORDER — HYDROCODONE-ACETAMINOPHEN 5-325 MG PO TABS: 2.0000 | ORAL_TABLET | ORAL | Status: DC | PRN

## 2012-10-24 NOTE — ED Notes (Signed)
Ortho order completed per EMT Tresa Endo

## 2012-10-24 NOTE — ED Provider Notes (Signed)
History     CSN: [id number]  Arrival date & time 10/24/12  1052   First MD Initiated Contact with Patient 10/24/12 1212      Chief Complaint  Patient presents with  . Wrist Pain    right wrist and forearm    (Consider location/radiation/quality/duration/timing/severity/associated sxs/prior treatment) Patient is a 67 y.o. female presenting with wrist pain. The history is provided by the patient. No language interpreter was used.  Wrist Pain This is a new problem. The current episode started today. The problem occurs constantly. The problem has been gradually worsening. Associated symptoms include joint swelling and myalgias. Pertinent negatives include no fever. Nothing aggravates the symptoms. She has tried nothing for the symptoms. The treatment provided moderate relief.    Past Medical History  Diagnosis Date  . PONV (postoperative nausea and vomiting)   . Arthritis     Past Surgical History  Procedure Laterality Date  . Abdominal hysterectomy  1992    partial  . Tonsillectomy      age 39  . Dilation and curettage of uterus  1985  . Joint replacement  11/2006    right knee  . Partial knee arthroplasty  11/19/2011    Procedure: UNICOMPARTMENTAL KNEE;  Surgeon: Shelda Pal, MD;  Location: [facility] ORS;  Service: Orthopedics;  Laterality: Left;  Medial     History reviewed. No pertinent family history.  History  Substance Use Topics  . Smoking status: Not on file  . Smokeless tobacco: Not on file  . Alcohol Use: Yes     Comment: weekly-wine    OB History   Grav Para Term Preterm Abortions TAB SAB Ect Mult Living                  Review of Systems  Constitutional: Negative for fever.  Musculoskeletal: Positive for myalgias and joint swelling.  All other systems reviewed and are negative.    Allergies  Penicillins  Home Medications   Current Outpatient Rx  Name  Route  Sig  Dispense  Refill  . meloxicam (MOBIC) 7.5 MG tablet   Oral   Take 7.5 mg by mouth  daily.         . calcium-vitamin D (OSCAL WITH D) 500-200 MG-UNIT per tablet   Oral   Take 1 tablet by mouth daily.         . cetirizine (ZYRTEC) 10 MG tablet   Oral   Take 10 mg by mouth daily.         . cholecalciferol (VITAMIN D) 1000 UNITS tablet   Oral   Take 1,000 Units by mouth daily.         Marland Kitchen EXPIRED: diphenhydrAMINE (BENADRYL) 25 mg capsule   Oral   Take 1 capsule (25 mg total) by mouth every 6 (six) hours as needed for itching, allergies or sleep.   30 capsule      . estrogens, conjugated, (PREMARIN) 0.45 MG tablet   Oral   Take 0.45 mg by mouth daily. Take daily for 21 days then do not take for 7 days.         . ferrous sulfate 325 (65 FE) MG tablet   Oral   Take 1 tablet (325 mg total) by mouth 3 (three) times daily after meals.         . Multiple Vitamin (MULTIVITAMIN WITH MINERALS) TABS   Oral   Take 1 tablet by mouth daily.  BP 138/87  Pulse 84  Temp(Src) 98.2 F (36.8 C) (Oral)  Resp 20  Wt 155 lb (70.308 kg)  BMI 27.46 kg/m2  SpO2 96%  Physical Exam  Nursing note and vitals reviewed. Constitutional: She is oriented to person, place, and time. She appears well-developed and well-nourished.  Musculoskeletal: She exhibits tenderness.  Swollen tender, nv and ns intact  Neurological: She is alert and oriented to person, place, and time. She has normal reflexes.  Skin: Skin is warm.  Psychiatric: She has a normal mood and affect.    ED Course  Procedures (including critical care time)  Labs Reviewed - No data to display Dg Wrist Complete Right  10/24/2012   *RADIOLOGY REPORT*  Clinical Data:  Fall, right wrist pain.  RIGHT WRIST - COMPLETE 3+ VIEW  Comparison: None.  Findings: The patient has a mildly impacted fracture of the distal radius.  No other fracture is identified.  Degenerative change at the first [facility] and scaphoid trapezium trapezoid joints noted.  IMPRESSION: Acute, mildly impacted distal radius fracture.    Original Report Authenticated By: Holley Dexter, M.D.     No diagnosis found.    MDM  Splint, ice elevate,   Schedule to see Dr. Melvyn Novas for evaluation next week.       Lonia Skinner [place], PA-C 10/24/12 1303

## 2012-10-24 NOTE — ED Provider Notes (Signed)
Medical screening examination/treatment/procedure(s) were performed by non-physician practitioner and as supervising physician I was immediately available for consultation/collaboration.   Rolan Bucco, MD 10/24/12 [phone]

## 2012-10-24 NOTE — ED Notes (Signed)
Patient transported to X-ray 

## 2012-10-24 NOTE — ED Notes (Signed)
Pt reports falling while playing tennis and presents to Ed with right wrist and forearm pain, can move digits well. Ice applied at scene.

## 2015-08-01 ENCOUNTER — Encounter: Payer: Self-pay | Admitting: Internal Medicine

## 2015-10-10 ENCOUNTER — Encounter (HOSPITAL_COMMUNITY): Payer: Self-pay | Admitting: Emergency Medicine

## 2015-10-10 ENCOUNTER — Emergency Department (HOSPITAL_COMMUNITY)
Admission: EM | Admit: 2015-10-10 | Discharge: 2015-10-10 | Disposition: A | Discharge status: Home / Self Care | Payer: Medicare Other | Attending: Emergency Medicine | Admitting: Emergency Medicine

## 2015-10-10 ENCOUNTER — Emergency Department (HOSPITAL_COMMUNITY): Payer: Medicare Other

## 2015-10-10 DIAGNOSIS — W01198A Fall on same level from slipping, tripping and stumbling with subsequent striking against other object, initial encounter: Secondary | ICD-10-CM | POA: Insufficient documentation

## 2015-10-10 DIAGNOSIS — Z96651 Presence of right artificial knee joint: Secondary | ICD-10-CM | POA: Insufficient documentation

## 2015-10-10 DIAGNOSIS — Z791 Long term (current) use of non-steroidal anti-inflammatories (NSAID): Secondary | ICD-10-CM | POA: Diagnosis not present

## 2015-10-10 DIAGNOSIS — M1990 Unspecified osteoarthritis, unspecified site: Secondary | ICD-10-CM | POA: Diagnosis not present

## 2015-10-10 DIAGNOSIS — Y939 Activity, unspecified: Secondary | ICD-10-CM | POA: Diagnosis not present

## 2015-10-10 DIAGNOSIS — Y929 Unspecified place or not applicable: Secondary | ICD-10-CM | POA: Insufficient documentation

## 2015-10-10 DIAGNOSIS — Y999 Unspecified external cause status: Secondary | ICD-10-CM | POA: Insufficient documentation

## 2015-10-10 DIAGNOSIS — Z79899 Other long term (current) drug therapy: Secondary | ICD-10-CM | POA: Diagnosis not present

## 2015-10-10 DIAGNOSIS — Z79891 Long term (current) use of opiate analgesic: Secondary | ICD-10-CM | POA: Insufficient documentation

## 2015-10-10 DIAGNOSIS — IMO0002 Reserved for concepts with insufficient information to code with codable children: Secondary | ICD-10-CM

## 2015-10-10 DIAGNOSIS — S81812A Laceration without foreign body, left lower leg, initial encounter: Secondary | ICD-10-CM | POA: Insufficient documentation

## 2015-10-10 MED ORDER — LIDOCAINE-EPINEPHRINE (PF) 2 %-1:200000 IJ SOLN
INTRAMUSCULAR | Status: AC
  Administered 2015-10-10: 21:00:00
  Filled 2015-10-10: qty 20

## 2015-10-10 MED ORDER — LIDOCAINE-EPINEPHRINE (PF) 2 %-1:200000 IJ SOLN
10.0000 mL | Freq: Once | INTRAMUSCULAR | Status: DC
  Filled 2015-10-10: qty 20

## 2015-10-10 MED ORDER — BACITRACIN ZINC 500 UNIT/GM EX OINT
1.0000 "application " | TOPICAL_OINTMENT | Freq: Two times a day (BID) | CUTANEOUS | Status: DC
  Administered 2015-10-10: 1 via TOPICAL
  Filled 2015-10-10: qty 28.35

## 2015-10-10 MED ORDER — BACITRACIN ZINC 500 UNIT/GM EX OINT: 1.0000 "application " | TOPICAL_OINTMENT | Freq: Two times a day (BID) | CUTANEOUS | Status: DC

## 2015-10-10 MED ORDER — IBUPROFEN 400 MG PO TABS: 400.0000 mg | ORAL_TABLET | Freq: Four times a day (QID) | ORAL | Status: AC | PRN

## 2015-10-10 MED ORDER — BACITRACIN ZINC 500 UNIT/GM EX OINT
TOPICAL_OINTMENT | CUTANEOUS | Status: AC
  Filled 2015-10-10: qty 0.9

## 2015-10-10 NOTE — Discharge Instructions (Signed)
Keep wound clean using antibacterial soap (Dial) and water, pat dry. You may apply a small amount of bacitracin antibiotic ointment to wound daily. Change dressing daily. You may also take ibuprofen as prescribed over-the-counter and using for pain relief. I recommend continuing to rest, elevate and ice your leg for 15-20 minutes 3-4 times daily to help with pain and swelling. Return to the emergency department or be seen by her primary care provider in 7 days for suture removal. Please return to the Emergency Department if symptoms worsen or new onset of fever, redness, swelling, warmth, drainage, numbness, tingling, weakness.

## 2015-10-10 NOTE — ED Notes (Signed)
Patient was sent from [facility] for laceration and swelling to [facility].

## 2015-10-10 NOTE — ED Provider Notes (Signed)
CSN: [id number]     Arrival date & time 10/10/15  1633 History  By signing my name below, I, Eustaquio Maize, attest that this documentation has been prepared under the direction and in the presence of Harlene Ramus, [place]. Electronically Signed: Eustaquio Maize, ED Scribe. 10/10/2015. 7:45 PM.   Chief Complaint  Patient presents with  . sent from [facility] for leg swelling   . Laceration   The history is provided by the patient. No language interpreter was used.    HPI Comments: [name] is a 70 y.o. female who presents to the Emergency Department complaining of laceration to LLE s/p ground level fall that occurred earlier today at 2 PM (approximately 4.5 hours ago). Pt reports that she tripped and fell, scraping her left lower leg on the stump of a plant. No head injury or LOC. She noticed a laceration to the area and went to her PCP's at [facility]. Pt states that while in the waiting room her leg began to bleed more profusely and the laceration tore open, causing [facility] to send pt to the ED for further evaluation. Bleeding is now controlled. She complains of sudden onset, constant, 4/10, pain and swelling to the area. She also notes an intermittent tingling sensation to the left ankle. Pt has applied hydrogen peroxide and ice with mild relief. She is not on any anticoagulants. Tetanus up to date. Denies weakness, numbness, or any other associated symptoms.   Past Medical History  Diagnosis Date  . PONV (postoperative nausea and vomiting)   . Arthritis    Past Surgical History  Procedure Laterality Date  . Abdominal hysterectomy  1992    partial  . Tonsillectomy      age 21  . Dilation and curettage of uterus  1985  . Joint replacement  11/2006    right knee  . Partial knee arthroplasty  11/19/2011    Procedure: UNICOMPARTMENTAL KNEE;  Surgeon: Mauri Pole, MD;  Location: [facility];  Service: Orthopedics;  Laterality: Left;  Medial    No family history on file. Social  History  Substance Use Topics  . Smoking status: Never Smoker   . Smokeless tobacco: None  . Alcohol Use: Yes     Comment: weekly-wine   OB History    No data available     Review of Systems  Musculoskeletal: Positive for arthralgias.  Skin: Positive for wound (laceration to LLE).  Neurological: Negative for weakness and numbness.       + Tingling to the left ankle    Allergies  Penicillins  Home Medications   Prior to Admission medications   Medication Sig Start Date End Date Taking? Authorizing Provider  bacitracin ointment Apply 1 application topically 2 (two) times daily. 10/10/15   Nona Dell, PA-C  calcium-vitamin D (OSCAL WITH D) 500-200 MG-UNIT per tablet Take 1 tablet by mouth daily.    Historical Provider, MD  cetirizine (ZYRTEC) 10 MG tablet Take 10 mg by mouth daily.    Historical Provider, MD  cholecalciferol (VITAMIN D) 1000 UNITS tablet Take 1,000 Units by mouth daily.    Historical Provider, MD  diphenhydrAMINE (BENADRYL) 25 mg capsule Take 1 capsule (25 mg total) by mouth every 6 (six) hours as needed for itching, allergies or sleep. 11/20/11 11/30/11  Danae Orleans, PA-C  estrogens, conjugated, (PREMARIN) 0.45 MG tablet Take 0.45 mg by mouth daily. Take daily for 21 days then do not take for 7 days.    Historical Provider, MD  ferrous sulfate 325 (65 FE) MG tablet Take 1 tablet (325 mg total) by mouth 3 (three) times daily after meals. 11/20/11 11/19/12  Danae Orleans, PA-C  HYDROcodone-acetaminophen (NORCO/VICODIN) 5-325 MG per tablet Take 2 tablets by mouth every 4 (four) hours as needed for pain. 10/24/12   Fransico Meadow, PA-C  ibuprofen (ADVIL,MOTRIN) 400 MG tablet Take 1 tablet (400 mg total) by mouth every 6 (six) hours as needed. 10/10/15   Nona Dell, PA-C  meloxicam (MOBIC) 7.5 MG tablet Take 7.5 mg by mouth daily.    Historical Provider, MD  Multiple Vitamin (MULTIVITAMIN WITH MINERALS) TABS Take 1 tablet by mouth daily.    Historical  Provider, MD   BP 163/82 mmHg  Pulse 80  Temp(Src) 97.9 F (36.6 C) (Oral)  Resp 18  SpO2 97%   Physical Exam  Constitutional: She is oriented to person, place, and time. She appears well-developed and well-nourished.  HENT:  Head: Normocephalic and atraumatic.  Eyes: Conjunctivae and EOM are normal. Right eye exhibits no discharge. Left eye exhibits no discharge. No scleral icterus.  Pulmonary/Chest: Effort normal.  Musculoskeletal: She exhibits no tenderness.  Full ROM of left knee, ankle, and foot with no TTP. 2+ DP Pulse. Sensation grossly intact. Cap refill < 2.   Neurological: She is alert and oriented to person, place, and time.  Skin:  7 cm gaping superficial laceration and skin abrasion noted to left anterior lateral shin. No active bleeding. No visible foreign bodies.   Nursing note and vitals reviewed.     ED Course  Procedures (including critical care time)  LACERATION REPAIR PROCEDURE NOTE The patient's identification was confirmed and consent was obtained. This procedure was performed by Harlene Ramus, PA-C at 7:44 PM. Site: Left anterior lateral shin Sterile procedures observed Anesthetic used (type and amt): 8 CCs 2% Lidocaine with Epi Suture type/size:4-0 Proline Length:7 cm # of Sutures: 4 simple interrupted, 5 horizontal mattresses  Technique: Simple interrupted and horizontal mattresses Complexity: Simple Antibx ointment applied Tetanus UTD or ordered: UTD Site anesthetized, irrigated with NS, explored without evidence of foreign body, wound well approximated, site covered with dry, sterile dressing.  Patient tolerated procedure well without complications. Instructions for care discussed verbally and patient provided with additional written instructions for homecare and f/u.   DIAGNOSTIC STUDIES: Oxygen Saturation is 97% on RA, normal by my interpretation.    COORDINATION OF CARE: 6:41 PM-Discussed treatment plan which includes consulting  attending physician, Dr. Alvino Chapel, with pt at bedside and pt agreed to plan.   6:57 PM-Discussed case with Dr. Alvino Chapel who suggests an xray.   Labs Review Labs Reviewed - No data to display  Imaging Review Dg Tibia/fibula Left  10/10/2015  CLINICAL DATA:  Status post fall, with injury to left tibia and fibula. Large laceration, with bleeding. Initial encounter. EXAM: LEFT TIBIA AND FIBULA - 2 VIEW COMPARISON:  None. FINDINGS: The known soft tissue laceration is difficult to fully characterize, though lateral soft tissue swelling is noted along the left lower leg. A medial compartment prosthesis is noted at the left knee. There is mild marginal osteophyte formation at the lateral compartment. No significant knee joint effusion is seen. There is no evidence of fracture or dislocation. The ankle mortise is grossly unremarkable in appearance. IMPRESSION: No evidence of fracture or dislocation. Known soft tissue laceration is difficult to fully characterize. Medial compartment prosthesis at the left knee is grossly unremarkable in appearance. Electronically Signed   By: Francoise Schaumann.D.  On: 10/10/2015 19:34   I have personally reviewed and evaluated these images as part of my medical decision-making.   EKG Interpretation None      MDM   Final diagnoses:  Laceration   Tetanus UTD. Laceration occurred < 12 hours prior to repair. Discussed laceration care with pt and answered questions. Pt to f-u for suture removal in 7 days and wound check sooner should there be signs of dehiscence or infection. Pt is hemodynamically stable with no complaints prior to dc.     I personally performed the services described in this documentation, which was scribed in my presence. The recorded information has been reviewed and is accurate.     Chesley Noon [place], [place] 10/10/15 2126  Davonna Belling, MD 10/10/15 [phone]

## 2015-10-10 NOTE — Progress Notes (Addendum)
Pt returned from the x-ray dept. (7:20pm)Wound irrigated. telfa dressing applied with bactracin. (8:40pm

## 2015-10-10 NOTE — ED Notes (Signed)
Leg elevated and icepack applied

## 2015-10-10 NOTE — ED Notes (Signed)
Dr Pickering at bedside 

## 2015-11-10 ENCOUNTER — Other Ambulatory Visit (HOSPITAL_COMMUNITY): Payer: Self-pay | Admitting: General Surgery

## 2015-11-10 ENCOUNTER — Ambulatory Visit (HOSPITAL_COMMUNITY)
Admission: RE | Admit: 2015-11-10 | Discharge: 2015-11-10 | Disposition: A | Discharge status: Home / Self Care | Payer: Medicare Other | Source: Ambulatory Visit | Attending: General Surgery | Admitting: General Surgery

## 2015-11-10 DIAGNOSIS — S81812A Laceration without foreign body, left lower leg, initial encounter: Secondary | ICD-10-CM

## 2015-11-10 DIAGNOSIS — X58XXXA Exposure to other specified factors, initial encounter: Secondary | ICD-10-CM | POA: Diagnosis not present

## 2015-11-10 DIAGNOSIS — R938 Abnormal findings on diagnostic imaging of other specified body structures: Secondary | ICD-10-CM | POA: Diagnosis not present

## 2015-11-10 DIAGNOSIS — R600 Localized edema: Secondary | ICD-10-CM | POA: Insufficient documentation

## 2015-11-10 LAB — POCT I-STAT CREATININE: CREATININE: 0.7 mg/dL (ref 0.44–1.00)

## 2015-11-10 MED ORDER — GADOBENATE DIMEGLUMINE 529 MG/ML IV SOLN
15.0000 mL | Freq: Once | INTRAVENOUS | Status: AC | PRN
  Administered 2015-11-10: 15 mL via INTRAVENOUS

## 2017-10-03 ENCOUNTER — Encounter: Payer: Self-pay | Admitting: Gastroenterology

## 2018-06-11 IMAGING — MR MR [PERSON_NAME] LOW WO/W CM*L*
5 of 9 series · 19 of 40 positions shown · IV contrast (15    MULTIHANCE)
Comparison: None.

CLINICAL DATA: Status post fall with laceration. Wound red and
swollen.

EXAM:
MRI OF LOWER LEFT EXTREMITY WITHOUT AND WITH CONTRAST
TECHNIQUE: Multiplanar, multisequence MR imaging of the left lower leg was
performed both before and after administration of intravenous
contrast.
CONTRAST:  15mL MULTIHANCE GADOBENATE DIMEGLUMINE 529 MG/ML IV SOLN

[Series 4: T1 · axial · 5.0mm · 0.74mm/px · z∈[-166,+185]mm · 4 of 51 slices shown (1 of 2)]
[im 1/51]
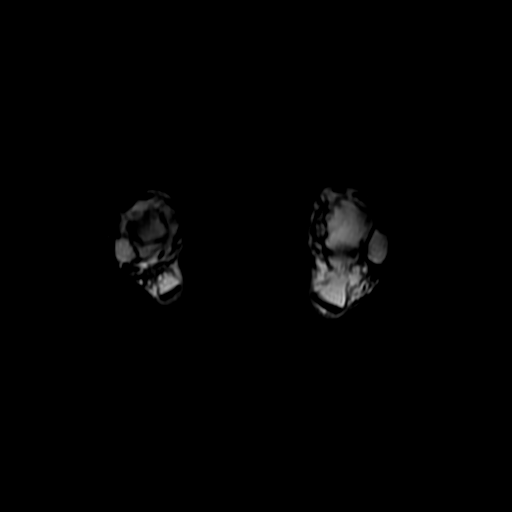
[im 17/51]
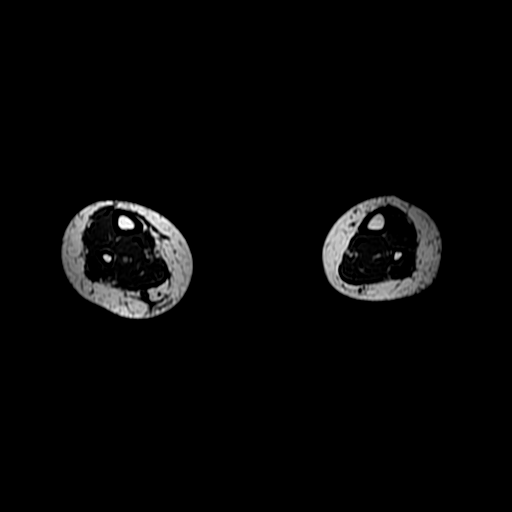
[im 34/51]
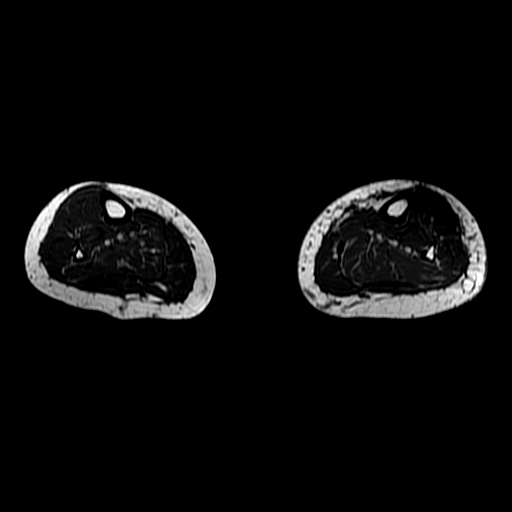
[im 51/51]
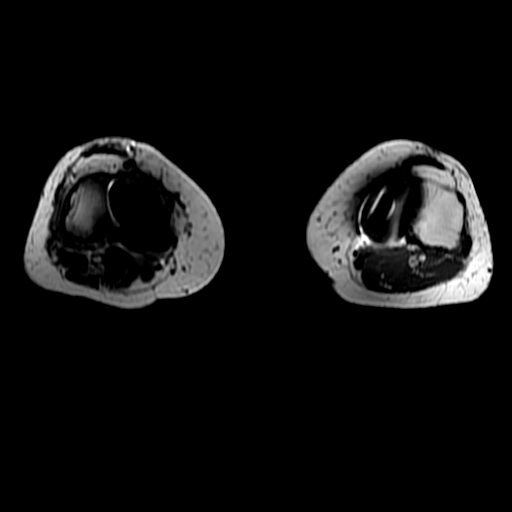

[Series 6: T2 fat-sat · axial · 5.0mm · 0.74mm/px · z∈[-166,+185]mm · 5 of 55 slices shown (1 of 2)]
[im 1/55]
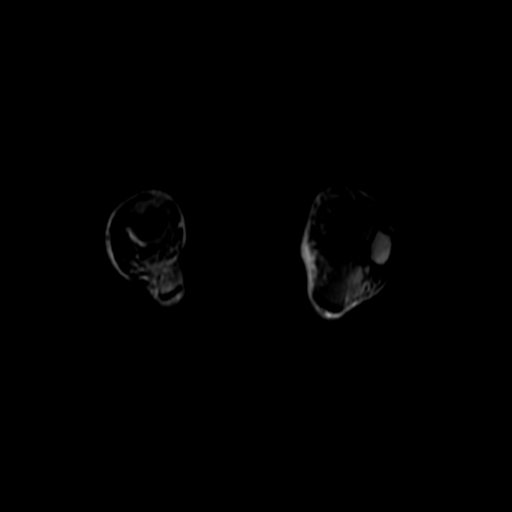
[im 14/55]
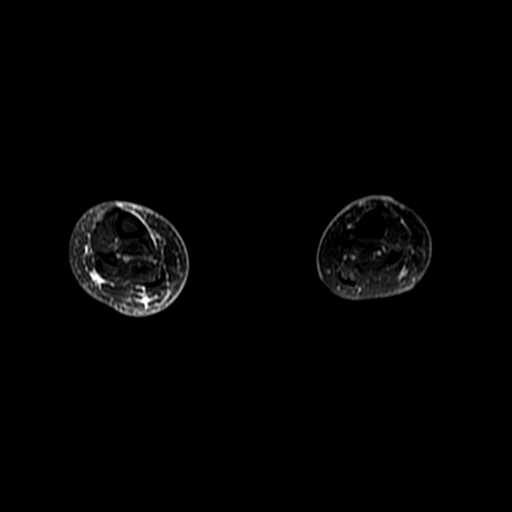
[im 28/55]
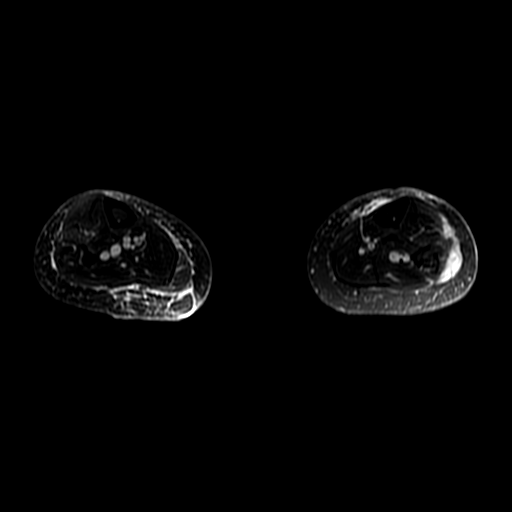
[im 41/55]
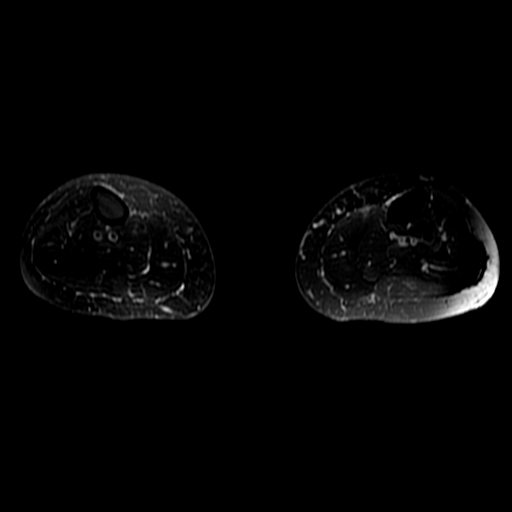
[im 55/55]
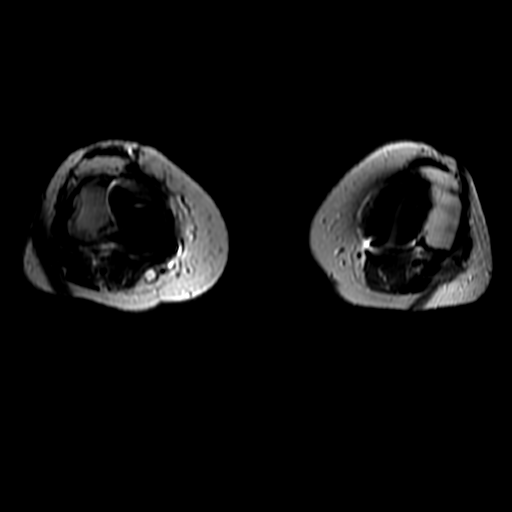

[Series 8: T1 · sagittal · 4.0mm · 0.70mm/px · 4 of 35 slices shown (2 of 2)]
[im 1/35]
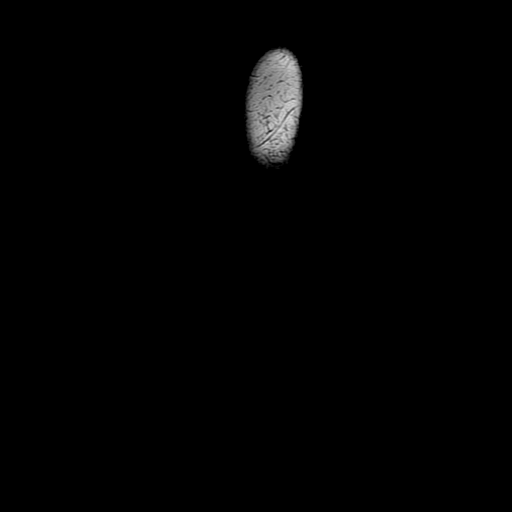
[im 12/35]
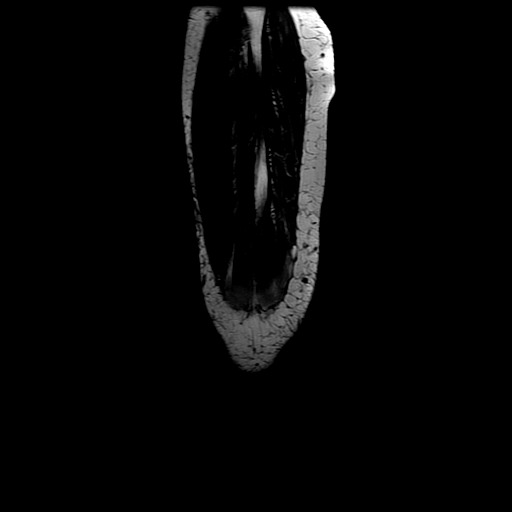
[im 23/35]
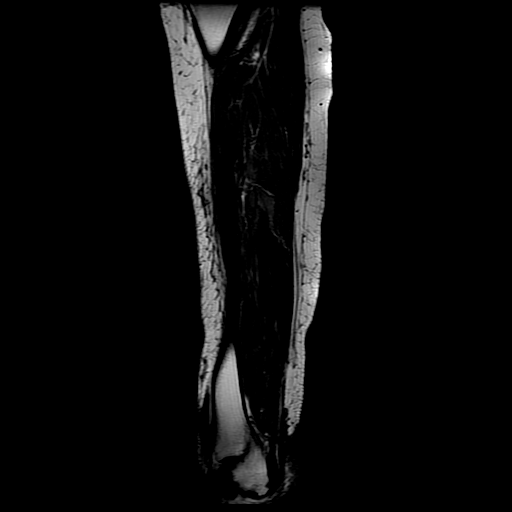
[im 35/35]
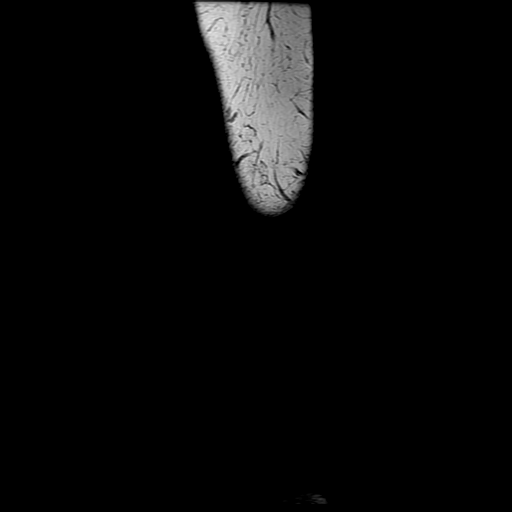

[Series 9: T1 fat-sat · sagittal · 4.0mm · 0.70mm/px · 2 of 35 slices shown]
[im 1/35]
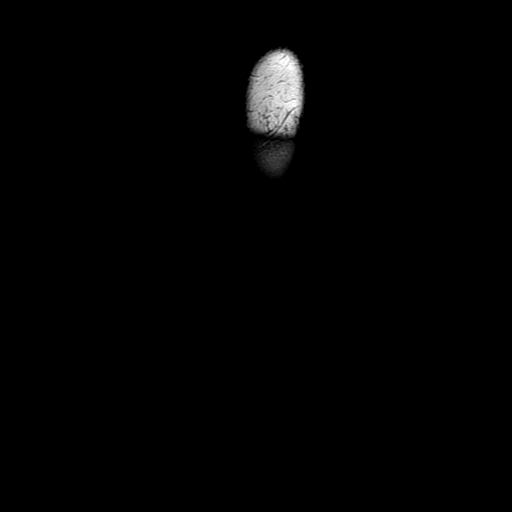
[im 12/35]
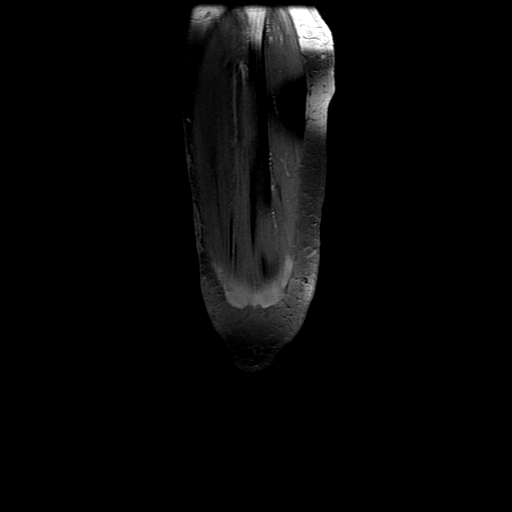

[Series 11: T2 fat-sat · sagittal · 4.0mm · 0.70mm/px · 4 of 35 slices shown (2 of 2)]
[im 1/35]
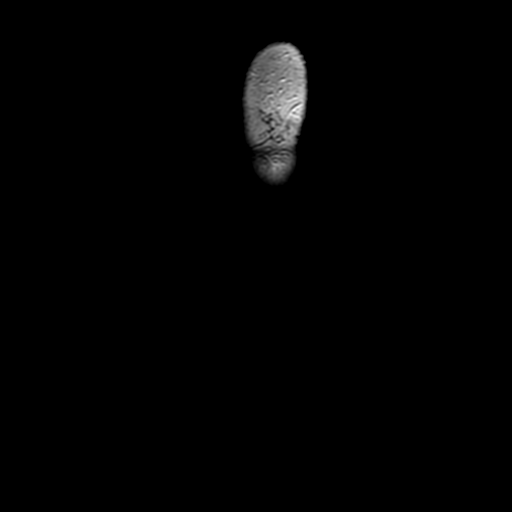
[im 12/35]
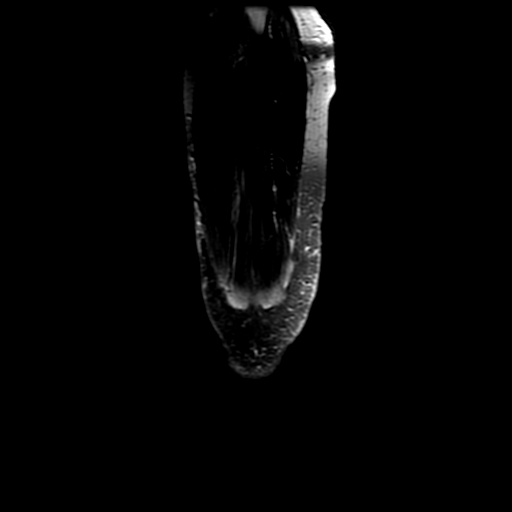
[im 23/35]
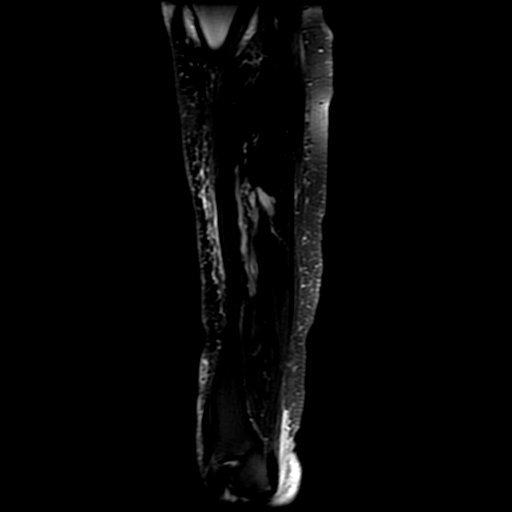
[im 35/35]
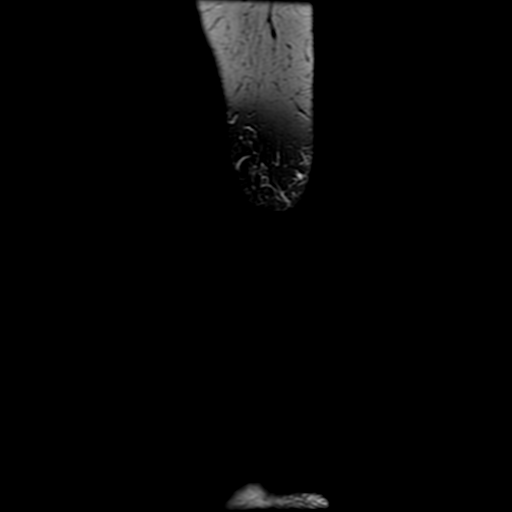

[19 of 40 positions shown; findings below may reference images not displayed]

FINDINGS: Bones/Joint/Cartilage

Bilateral medial femorotibial compartment arthroplasties. No focal
marrow signal abnormality. No fracture or dislocation. Normal
alignment.

Muscles

Very mild edema in the left peroneal musculature likely reactive.
Mild low level edema within the bilateral lateral gastrocnemius
muscle likely physiologic. No other muscle signal abnormality.

Soft tissue
1.5 x 5.6 x 7.5 cm mildly complex fluid collection along the fascia
overlying the left anterior compartment and peroneal compartment
muscles with peripheral T1 hyperintensity most concerning for a
hematoma versus infection. No significant surrounding soft tissue
edema. No soft tissue mass.
IMPRESSION: 1. 1.5 x 5.6 x 7.5 cm mildly complex fluid collection along the
fascia overlying the left anterior compartment and peroneal
compartment muscles most concerning for a hematoma versus less
likely infection given the lack of significant surrounding soft
tissue edema.
2. Very mild edema in the left peroneal musculature likely reactive.

## 2019-06-13 ENCOUNTER — Ambulatory Visit: Payer: BC Managed Care – PPO | Attending: Internal Medicine

## 2019-06-13 DIAGNOSIS — Z23 Encounter for immunization: Secondary | ICD-10-CM

## 2019-06-13 NOTE — Progress Notes (Signed)
   Covid-19 Vaccination Clinic  Name:  [name]    MRN: [id number] DOB: July 30, 1945  06/13/2019  Ms. Bradney was observed post Covid-19 immunization for 30 minutes based on pre-vaccination screening without incidence. She was provided with Vaccine Information Sheet and instruction to access the V-Safe system.   Ms. Tyner was instructed to call 911 with any severe reactions post vaccine: Marland Kitchen Difficulty breathing  . Swelling of your face and throat  . A fast heartbeat  . A bad rash all over your body  . Dizziness and weakness    Immunizations Administered    Name Date Dose VIS Date Route   Pfizer COVID-19 Vaccine 06/13/2019 11:49 AM 0.3 mL 05/01/2019 Intramuscular   Manufacturer: [place]   Lot: [id number]   [place]: [id number]

## 2019-07-04 ENCOUNTER — Ambulatory Visit: Payer: BC Managed Care – PPO | Attending: Internal Medicine

## 2019-07-04 DIAGNOSIS — Z23 Encounter for immunization: Secondary | ICD-10-CM

## 2019-07-04 NOTE — Progress Notes (Signed)
   Covid-19 Vaccination Clinic  Name:  [name]    MRN: [id number] DOB: 21-Oct-1945  07/04/2019  [name] was observed post Covid-19 immunization for 15 minutes without incidence. She was provided with Vaccine Information Sheet and instruction to access the V-Safe system.   [name] was instructed to call 911 with any severe reactions post vaccine: Marland Kitchen Difficulty breathing  . Swelling of your face and throat  . A fast heartbeat  . A bad rash all over your body  . Dizziness and weakness    Immunizations Administered    Name Date Dose VIS Date Route   Pfizer COVID-19 Vaccine 07/04/2019  1:39 PM 0.3 mL 05/01/2019 Intramuscular   Manufacturer: [place]   Lot: [id number]   [place]: [id number]

## 2020-02-01 ENCOUNTER — Encounter: Payer: Self-pay | Admitting: Gastroenterology

## 2020-03-19 LAB — COLOGUARD: COLOGUARD: NEGATIVE

## 2020-03-31 ENCOUNTER — Encounter: Payer: BC Managed Care – PPO | Admitting: Gastroenterology

## 2020-10-27 ENCOUNTER — Emergency Department (HOSPITAL_BASED_OUTPATIENT_CLINIC_OR_DEPARTMENT_OTHER)
Admission: EM | Admit: 2020-10-27 | Discharge: 2020-10-27 | Disposition: A | Discharge status: Home / Self Care | Payer: Medicare PPO | Attending: Emergency Medicine | Admitting: Emergency Medicine

## 2020-10-27 ENCOUNTER — Other Ambulatory Visit: Payer: Self-pay

## 2020-10-27 ENCOUNTER — Encounter (HOSPITAL_BASED_OUTPATIENT_CLINIC_OR_DEPARTMENT_OTHER): Payer: Self-pay | Admitting: Emergency Medicine

## 2020-10-27 DIAGNOSIS — Z96651 Presence of right artificial knee joint: Secondary | ICD-10-CM | POA: Diagnosis not present

## 2020-10-27 DIAGNOSIS — L03115 Cellulitis of right lower limb: Secondary | ICD-10-CM | POA: Insufficient documentation

## 2020-10-27 DIAGNOSIS — L299 Pruritus, unspecified: Secondary | ICD-10-CM | POA: Diagnosis present

## 2020-10-27 MED ORDER — DOXYCYCLINE HYCLATE 100 MG PO CAPS: 100.0000 mg | ORAL_CAPSULE | Freq: Two times a day (BID) | ORAL | 0 refills | Status: AC

## 2020-10-27 NOTE — ED Triage Notes (Signed)
Pt arrives to ED with c/o tick bite to right lower leg. Pt reports being bitten last Friday (6/3). Pt has tried cortisone creme, benadryl, and zyrtec w/o relief. Yesterday, a red rash developed just underneath the bite. The erythematic area is itchy and inflamed w/o pain.

## 2020-10-27 NOTE — ED Notes (Signed)
Patient verbalizes understanding of discharge instructions. Opportunity for questioning and answers were provided. Armband removed by staff, pt discharged from ED.  

## 2020-10-27 NOTE — ED Provider Notes (Signed)
[facility] Provider Note  CSN: [id number] Arrival date & time: 10/27/20 1153    History Chief Complaint  Patient presents with   Insect Bite    HPI  [name] is a 75 y.o. female reports she thinks something bit her on her R lower leg a few days ago, not sure if it was a tick or a spider perhaps, but she did not pull a tick off in either case. She was having some itching to a small red area for a few days, has been taking antihistamines and using Cortisone ointment without improvement. She went to her vascular doctor who told her it was not a blood clot. Today she noted increased redness and blistering. Not painful.    Past Medical History:  Diagnosis Date   Arthritis    PONV (postoperative nausea and vomiting)     Past Surgical History:  Procedure Laterality Date   ABDOMINAL HYSTERECTOMY  1992   partial   DILATION AND CURETTAGE OF UTERUS  1985   JOINT REPLACEMENT  11/2006   right knee   PARTIAL KNEE ARTHROPLASTY  11/19/2011   Procedure: UNICOMPARTMENTAL KNEE;  Surgeon: Mauri Pole, MD;  Location: [facility];  Service: Orthopedics;  Laterality: Left;  Medial    TONSILLECTOMY     age 70    History reviewed. No pertinent family history.  Social History   Tobacco Use   Smoking status: Never  Substance Use Topics   Alcohol use: Yes    Comment: weekly-wine   Drug use: No     Home Medications Prior to Admission medications   Medication Sig Start Date End Date Taking? Authorizing Provider  cetirizine (ZYRTEC) 10 MG tablet Take 10 mg by mouth daily.   Yes [provider]  doxycycline (VIBRAMYCIN) 100 MG capsule Take 1 capsule (100 mg total) by mouth 2 (two) times daily. 10/27/20  Yes Truddie Hidden, MD  calcium-vitamin D (OSCAL WITH D) 500-200 MG-UNIT per tablet Take 1 tablet by mouth daily.    [provider]  cholecalciferol (VITAMIN D) 1000 UNITS tablet Take 1,000 Units by mouth daily.    [provider]   diphenhydrAMINE (BENADRYL) 25 mg capsule Take 1 capsule (25 mg total) by mouth every 6 (six) hours as needed for itching, allergies or sleep. 11/20/11 11/30/11  Danae Orleans, PA-C  ferrous sulfate 325 (65 FE) MG tablet Take 1 tablet (325 mg total) by mouth 3 (three) times daily after meals. 11/20/11 11/19/12  Danae Orleans, PA-C  ibuprofen (ADVIL,MOTRIN) 400 MG tablet Take 1 tablet (400 mg total) by mouth every 6 (six) hours as needed. 10/10/15   Nona Dell, PA-C  meloxicam (MOBIC) 7.5 MG tablet Take 7.5 mg by mouth daily.    [provider]  Multiple Vitamin (MULTIVITAMIN WITH MINERALS) TABS Take 1 tablet by mouth daily.    [provider]     Allergies    Penicillins   Review of Systems   Review of Systems A comprehensive review of systems was completed and negative except as noted in HPI.    Physical Exam BP (!) 187/102 (BP Location: Right Arm)   Pulse 84   Temp 99.3 F (37.4 C) (Oral)   Resp 16   Ht 5\' 3"  (1.6 m)   Wt 79.4 kg   SpO2 98%   BMI 31.00 kg/m   Physical Exam Vitals and nursing note reviewed.  HENT:     Head: Normocephalic.     Nose: Nose  normal.  Eyes:     Extraocular Movements: Extraocular movements intact.  Pulmonary:     Effort: Pulmonary effort is normal.  Musculoskeletal:        General: Normal range of motion.     Cervical back: Neck supple.  Skin:    Findings: No rash (on exposed skin).     Comments: Area of erythema, induration, warmth and blistering on the L lower leg, just below the spot she identifies as the initial red spot.   Neurological:     Mental Status: She is alert and oriented to person, place, and time.  Psychiatric:        Mood and Affect: Mood normal.      ED Results / Procedures / Treatments   Labs (all labs ordered are listed, but only abnormal results are displayed) Labs Reviewed - No data to display  EKG None   Radiology No results found.  Procedures Procedures  Medications  Ordered in the ED Medications - No data to display   MDM Rules/Calculators/A&P MDM  Patient with rash concerning for developing cellulitis. No signs of systemic infection. Will begin doxycycline which should cover most skin infections as well as possible tick borne illness although, again, she did not actually see a tick on her.   ED Course  I have reviewed the triage vital signs and the nursing notes.  Pertinent labs & imaging results that were available during my care of the patient were reviewed by me and considered in my medical decision making (see chart for details).     Final Clinical Impression(s) / ED Diagnoses Final diagnoses:  Cellulitis of right leg without foot    Rx / DC Orders ED Discharge Orders          Ordered    doxycycline (VIBRAMYCIN) 100 MG capsule  2 times daily        10/27/20 1248             Truddie Hidden, MD 10/27/20 1248

## 2021-01-11 ENCOUNTER — Ambulatory Visit: Payer: Medicare PPO | Admitting: Physician Assistant

## 2021-01-11 ENCOUNTER — Encounter: Payer: Self-pay | Admitting: Physician Assistant

## 2021-01-11 ENCOUNTER — Other Ambulatory Visit: Payer: Self-pay

## 2021-01-11 DIAGNOSIS — L570 Actinic keratosis: Secondary | ICD-10-CM | POA: Diagnosis not present

## 2021-01-11 DIAGNOSIS — D235 Other benign neoplasm of skin of trunk: Secondary | ICD-10-CM

## 2021-01-11 DIAGNOSIS — Z85828 Personal history of other malignant neoplasm of skin: Secondary | ICD-10-CM

## 2021-01-11 DIAGNOSIS — Z872 Personal history of diseases of the skin and subcutaneous tissue: Secondary | ICD-10-CM

## 2021-01-11 DIAGNOSIS — D485 Neoplasm of uncertain behavior of skin: Secondary | ICD-10-CM

## 2021-01-11 DIAGNOSIS — Z1283 Encounter for screening for malignant neoplasm of skin: Secondary | ICD-10-CM | POA: Diagnosis not present

## 2021-01-11 DIAGNOSIS — D1801 Hemangioma of skin and subcutaneous tissue: Secondary | ICD-10-CM

## 2021-01-11 DIAGNOSIS — L988 Other specified disorders of the skin and subcutaneous tissue: Secondary | ICD-10-CM | POA: Diagnosis not present

## 2021-01-11 MED ORDER — TRETINOIN 0.05 % EX CREA: TOPICAL_CREAM | CUTANEOUS | 0 refills | Status: AC

## 2021-01-11 NOTE — Patient Instructions (Signed)

## 2021-01-11 NOTE — Progress Notes (Signed)
   Follow-Up Visit   Subjective  [name] is a 75 y.o. female who presents for the following: Annual Exam (Center of upper back x months raised sometimes painful. History of many seborrheic keratosis and per paper chart bcc right cheek 1996. Full body body skin check).   The following portions of the chart were reviewed this encounter and updated as appropriate:  Tobacco  Allergies  Meds  Problems  Med Hx  Surg Hx  Fam Hx      Objective  Well appearing patient in no apparent distress; mood and affect are within normal limits.  A full examination was performed including scalp, head, eyes, ears, nose, lips, neck, chest, axillae, abdomen, back, buttocks, bilateral upper extremities, bilateral lower extremities, hands, feet, fingers, toes, fingernails, and toenails. All findings within normal limits unless otherwise noted below.  Left Upper Back Hyperkeratotic scale with pink base      Dorsum of Nose, Neck (7) Very small pink crust  Head - Anterior (Face) Fine lines   Assessment & Plan  Neoplasm of uncertain behavior of skin Left Upper Back  Skin / nail biopsy Type of biopsy: tangential   Informed consent: discussed and consent obtained   Timeout: patient name, date of birth, surgical site, and procedure verified   Anesthesia: the lesion was anesthetized in a standard fashion   Anesthetic:  1% lidocaine w/ epinephrine 1-100,000 local infiltration Instrument used: flexible razor blade   Hemostasis achieved with: aluminum chloride and electrodesiccation   Outcome: patient tolerated procedure well   Post-procedure details: wound care instructions given    Specimen 1 - Surgical pathology Differential Diagnosis: bcc scc  Check Margins: No  AK (actinic keratosis) (8) Dorsum of Nose; Neck (7)  Destruction of lesion - Dorsum of Nose, Neck Complexity: simple   Destruction method: cryotherapy   Informed consent: discussed and consent obtained   Timeout:  patient  name, date of birth, surgical site, and procedure verified Lesion destroyed using liquid nitrogen: Yes   Cryotherapy cycles:  3 Outcome: patient tolerated procedure well with no complications   Post-procedure details: wound care instructions given    [place] Head - Anterior (Face)  tretinoin (RETIN-A) 0.05 % cream - Head - Anterior (Face) Apply to face nightly    I, Enmanuel Zufall, PA-C, have reviewed all documentation's for this visit.  The documentation on 01/11/21 for the exam, diagnosis, procedures and orders are all accurate and complete.

## 2022-01-11 ENCOUNTER — Ambulatory Visit: Payer: Medicare PPO | Admitting: Physician Assistant

## 2023-01-16 DIAGNOSIS — Z6832 Body mass index (BMI) 32.0-32.9, adult: Secondary | ICD-10-CM | POA: Diagnosis not present

## 2023-01-16 DIAGNOSIS — R0981 Nasal congestion: Secondary | ICD-10-CM | POA: Diagnosis not present

## 2023-01-16 DIAGNOSIS — H9311 Tinnitus, right ear: Secondary | ICD-10-CM | POA: Diagnosis not present

## 2023-01-25 DIAGNOSIS — H90A21 Sensorineural hearing loss, unilateral, right ear, with restricted hearing on the contralateral side: Secondary | ICD-10-CM | POA: Diagnosis not present

## 2023-01-25 DIAGNOSIS — H9121 Sudden idiopathic hearing loss, right ear: Secondary | ICD-10-CM | POA: Diagnosis not present

## 2023-02-07 DIAGNOSIS — H90A21 Sensorineural hearing loss, unilateral, right ear, with restricted hearing on the contralateral side: Secondary | ICD-10-CM | POA: Diagnosis not present

## 2023-02-12 DIAGNOSIS — Z124 Encounter for screening for malignant neoplasm of cervix: Secondary | ICD-10-CM | POA: Diagnosis not present

## 2023-02-12 DIAGNOSIS — Z6832 Body mass index (BMI) 32.0-32.9, adult: Secondary | ICD-10-CM | POA: Diagnosis not present

## 2023-02-12 DIAGNOSIS — Z1231 Encounter for screening mammogram for malignant neoplasm of breast: Secondary | ICD-10-CM | POA: Diagnosis not present

## 2023-02-12 DIAGNOSIS — R87620 Atypical squamous cells of undetermined significance on cytologic smear of vagina (ASC-US): Secondary | ICD-10-CM | POA: Diagnosis not present

## 2023-02-13 DIAGNOSIS — H9121 Sudden idiopathic hearing loss, right ear: Secondary | ICD-10-CM | POA: Diagnosis not present

## 2023-03-14 DIAGNOSIS — H9121 Sudden idiopathic hearing loss, right ear: Secondary | ICD-10-CM | POA: Diagnosis not present

## 2023-03-14 DIAGNOSIS — Z0000 Encounter for general adult medical examination without abnormal findings: Secondary | ICD-10-CM | POA: Diagnosis not present

## 2023-03-14 DIAGNOSIS — Z1389 Encounter for screening for other disorder: Secondary | ICD-10-CM | POA: Diagnosis not present

## 2023-03-14 DIAGNOSIS — Z6832 Body mass index (BMI) 32.0-32.9, adult: Secondary | ICD-10-CM | POA: Diagnosis not present

## 2023-03-14 DIAGNOSIS — E66811 Obesity, class 1: Secondary | ICD-10-CM | POA: Diagnosis not present

## 2023-03-14 DIAGNOSIS — E782 Mixed hyperlipidemia: Secondary | ICD-10-CM | POA: Diagnosis not present

## 2023-03-20 DIAGNOSIS — Z1211 Encounter for screening for malignant neoplasm of colon: Secondary | ICD-10-CM | POA: Diagnosis not present

## 2023-03-27 LAB — COLOGUARD: COLOGUARD: NEGATIVE

## 2023-05-29 DIAGNOSIS — M25561 Pain in right knee: Secondary | ICD-10-CM | POA: Diagnosis not present

## 2023-08-21 DIAGNOSIS — Z6833 Body mass index (BMI) 33.0-33.9, adult: Secondary | ICD-10-CM | POA: Diagnosis not present

## 2023-08-21 DIAGNOSIS — R600 Localized edema: Secondary | ICD-10-CM | POA: Diagnosis not present

## 2023-08-21 DIAGNOSIS — R399 Unspecified symptoms and signs involving the genitourinary system: Secondary | ICD-10-CM | POA: Diagnosis not present

## 2023-09-13 DIAGNOSIS — M1711 Unilateral primary osteoarthritis, right knee: Secondary | ICD-10-CM | POA: Diagnosis not present

## 2023-10-15 DIAGNOSIS — L814 Other melanin hyperpigmentation: Secondary | ICD-10-CM | POA: Diagnosis not present

## 2023-10-15 DIAGNOSIS — L820 Inflamed seborrheic keratosis: Secondary | ICD-10-CM | POA: Diagnosis not present

## 2023-10-15 DIAGNOSIS — C4441 Basal cell carcinoma of skin of scalp and neck: Secondary | ICD-10-CM | POA: Diagnosis not present

## 2023-10-15 DIAGNOSIS — L918 Other hypertrophic disorders of the skin: Secondary | ICD-10-CM | POA: Diagnosis not present

## 2023-10-15 DIAGNOSIS — L821 Other seborrheic keratosis: Secondary | ICD-10-CM | POA: Diagnosis not present

## 2023-10-15 DIAGNOSIS — D1801 Hemangioma of skin and subcutaneous tissue: Secondary | ICD-10-CM | POA: Diagnosis not present

## 2023-10-15 DIAGNOSIS — D229 Melanocytic nevi, unspecified: Secondary | ICD-10-CM | POA: Diagnosis not present

## 2023-10-15 DIAGNOSIS — L570 Actinic keratosis: Secondary | ICD-10-CM | POA: Diagnosis not present

## 2023-10-15 DIAGNOSIS — L578 Other skin changes due to chronic exposure to nonionizing radiation: Secondary | ICD-10-CM | POA: Diagnosis not present

## 2023-10-15 DIAGNOSIS — D485 Neoplasm of uncertain behavior of skin: Secondary | ICD-10-CM | POA: Diagnosis not present

## 2023-10-31 DIAGNOSIS — C4441 Basal cell carcinoma of skin of scalp and neck: Secondary | ICD-10-CM | POA: Diagnosis not present

## 2023-12-04 DIAGNOSIS — M1711 Unilateral primary osteoarthritis, right knee: Secondary | ICD-10-CM | POA: Diagnosis not present

## 2023-12-04 DIAGNOSIS — Z96651 Presence of right artificial knee joint: Secondary | ICD-10-CM | POA: Diagnosis not present

## 2024-01-14 DIAGNOSIS — M1711 Unilateral primary osteoarthritis, right knee: Secondary | ICD-10-CM | POA: Diagnosis not present

## 2024-03-07 ENCOUNTER — Emergency Department (HOSPITAL_BASED_OUTPATIENT_CLINIC_OR_DEPARTMENT_OTHER): Admission: EM | Admit: 2024-03-07 | Discharge: 2024-03-07 | Discharge status: Absent without leave

## 2024-03-08 ENCOUNTER — Encounter (HOSPITAL_BASED_OUTPATIENT_CLINIC_OR_DEPARTMENT_OTHER): Payer: Self-pay | Admitting: Emergency Medicine

## 2024-03-08 ENCOUNTER — Other Ambulatory Visit: Payer: Self-pay

## 2024-03-08 ENCOUNTER — Emergency Department (HOSPITAL_BASED_OUTPATIENT_CLINIC_OR_DEPARTMENT_OTHER)
Admission: EM | Admit: 2024-03-08 | Discharge: 2024-03-08 | Disposition: A | Discharge status: Home / Self Care | Attending: Emergency Medicine | Admitting: Emergency Medicine

## 2024-03-08 DIAGNOSIS — N390 Urinary tract infection, site not specified: Secondary | ICD-10-CM | POA: Insufficient documentation

## 2024-03-08 DIAGNOSIS — R3915 Urgency of urination: Secondary | ICD-10-CM | POA: Diagnosis present

## 2024-03-08 LAB — URINALYSIS, ROUTINE W REFLEX MICROSCOPIC
Bilirubin Urine: NEGATIVE
Glucose, UA: NEGATIVE mg/dL
Ketones, ur: NEGATIVE mg/dL
Nitrite: NEGATIVE
Specific Gravity, Urine: 1.018 (ref 1.005–1.030)
pH: 5.5 (ref 5.0–8.0)

## 2024-03-08 MED ORDER — CEPHALEXIN 500 MG PO CAPS: 500.0000 mg | ORAL_CAPSULE | Freq: Three times a day (TID) | ORAL | 0 refills | Status: AC

## 2024-03-08 NOTE — ED Provider Notes (Signed)
  EMERGENCY DEPARTMENT AT [facility] Provider Note   CSN: [id number] Arrival date & time: 03/08/24  9193     Patient presents with: Urinary Frequency   [name] is a 78 y.o. female.  {Add pertinent medical, surgical, social history, OB history to HPI:32947} HPI    Yesterday AM, worse urinary symptoms ever had. Tried to run to the store and had to go again. Urgency. Later in the AM started having diarrhea, then contacted dr and not open and told  to come. 2-3 episodes of diarrhea, loose.  When had the urgency started taking AZO.  Both the urgency and diarrhea stopped.  Only had UTI one time and had a few times with urgency without UTI and went away.  Dr concerned could be something else/  No abdominal pain Just urgency and frequency No flank pain, no nausea, vomiting or fever   Past Medical History:  Diagnosis Date   Arthritis    Basal cell carcinoma 1996   right cheek per paper chart Dr Livingston   PONV (postoperative nausea and vomiting)      Prior to Admission medications   Medication Sig Start Date End Date Taking? Authorizing Provider  celecoxib  (CELEBREX ) 200 MG capsule TAKE 1 CAPSULE BY MOUTH EVERY DAY AS NEEDED 12/21/23  Yes [provider]  calcium-vitamin D (OSCAL WITH D) 500-200 MG-UNIT per tablet Take 1 tablet by mouth daily.    [provider]  cetirizine (ZYRTEC) 10 MG tablet Take 10 mg by mouth daily.    [provider]  cholecalciferol (VITAMIN D) 1000 UNITS tablet Take 1,000 Units by mouth daily.    [provider]  diphenhydrAMINE  (BENADRYL ) 25 mg capsule Take 1 capsule (25 mg total) by mouth every 6 (six) hours as needed for itching, allergies or sleep. 11/20/11 11/30/11  Danella Cough, PA-C  doxycycline  (VIBRAMYCIN ) 100 MG capsule Take 1 capsule (100 mg total) by mouth 2 (two) times daily. Patient not taking: Reported on 01/11/2021 10/27/20   Roselyn Carlin NOVAK, MD  ferrous sulfate  325 (65 FE) MG tablet  Take 1 tablet (325 mg total) by mouth 3 (three) times daily after meals. 11/20/11 11/19/12  Danella Cough, PA-C  ibuprofen  (ADVIL ,MOTRIN ) 400 MG tablet Take 1 tablet (400 mg total) by mouth every 6 (six) hours as needed. 10/10/15   Nevelyn Nat Norris, PA-C  meloxicam (MOBIC) 7.5 MG tablet Take 7.5 mg by mouth daily.    [provider]  Multiple Vitamin (MULTIVITAMIN WITH MINERALS) TABS Take 1 tablet by mouth daily.    [provider]  tretinoin  (RETIN-A ) 0.05 % cream Apply to face nightly 01/11/21   Sheffield, Kelli R, PA-C    Allergies: Penicillins    Review of Systems  Updated Vital Signs BP (!) 161/87   Pulse 82   Temp 98 F (36.7 C) (Oral)   Resp 16   Ht 5' 3 (1.6 m)   Wt 79.4 kg   SpO2 98%   BMI 31.00 kg/m   Physical Exam  (all labs ordered are listed, but only abnormal results are displayed) Labs Reviewed  URINALYSIS, ROUTINE W REFLEX MICROSCOPIC - Abnormal; Notable for the following components:      Result Value   APPearance HAZY (*)    Hgb urine dipstick TRACE (*)    Protein, ur TRACE (*)    Leukocytes,Ua MODERATE (*)    Bacteria, UA RARE (*)    Non Squamous Epithelial 11-20 (*)    All other components within normal limits  EKG: None  Radiology: No results found.  {Document cardiac monitor, telemetry assessment procedure when appropriate:32947} Procedures   Medications Ordered in the ED - No data to display    {Click here for ABCD2, HEART and other calculators REFRESH Note before signing:1}                              Medical Decision Making Amount and/or Complexity of Data Reviewed Labs: ordered.   ***  {Document critical care time when appropriate  Document review of labs and clinical decision tools ie CHADS2VASC2, etc  Document your independent review of radiology images and any outside records  Document your discussion with family members, caretakers and with consultants  Document social determinants of health  affecting pt's care  Document your decision making why or why not admission, treatments were needed:32947:::1}   Final diagnoses:  None    ED Discharge Orders     None

## 2024-03-08 NOTE — ED Notes (Signed)
 DC paperwork given and verbally understood.

## 2024-03-08 NOTE — ED Triage Notes (Signed)
 PT caox4, ambulatory, NAD c/o urinary frequency since yesterday, denies pain. Denies fever, but states she has had a slight chill. Took AZO yest which improved urinary frequency but had some diarrhea after.

## 2024-03-09 LAB — URINE CULTURE

## 2024-03-16 DIAGNOSIS — M1711 Unilateral primary osteoarthritis, right knee: Secondary | ICD-10-CM | POA: Diagnosis not present

## 2024-03-16 DIAGNOSIS — Z87440 Personal history of urinary (tract) infections: Secondary | ICD-10-CM | POA: Diagnosis not present

## 2024-03-16 DIAGNOSIS — R7303 Prediabetes: Secondary | ICD-10-CM | POA: Diagnosis not present

## 2024-03-16 DIAGNOSIS — E7800 Pure hypercholesterolemia, unspecified: Secondary | ICD-10-CM | POA: Diagnosis not present

## 2024-03-16 DIAGNOSIS — Z6835 Body mass index (BMI) 35.0-35.9, adult: Secondary | ICD-10-CM | POA: Diagnosis not present

## 2024-03-16 DIAGNOSIS — Z0000 Encounter for general adult medical examination without abnormal findings: Secondary | ICD-10-CM | POA: Diagnosis not present

## 2024-03-16 DIAGNOSIS — Z79899 Other long term (current) drug therapy: Secondary | ICD-10-CM | POA: Diagnosis not present

## 2024-03-16 DIAGNOSIS — E669 Obesity, unspecified: Secondary | ICD-10-CM | POA: Diagnosis not present

## 2024-03-16 DIAGNOSIS — Z1331 Encounter for screening for depression: Secondary | ICD-10-CM | POA: Diagnosis not present

## 2024-03-16 DIAGNOSIS — Z09 Encounter for follow-up examination after completed treatment for conditions other than malignant neoplasm: Secondary | ICD-10-CM | POA: Diagnosis not present

## 2024-04-14 DIAGNOSIS — E538 Deficiency of other specified B group vitamins: Secondary | ICD-10-CM | POA: Diagnosis not present

## 2024-04-14 DIAGNOSIS — M2550 Pain in unspecified joint: Secondary | ICD-10-CM | POA: Diagnosis not present

## 2024-04-14 DIAGNOSIS — E7211 Homocystinuria: Secondary | ICD-10-CM | POA: Diagnosis not present

## 2024-04-14 DIAGNOSIS — Z96651 Presence of right artificial knee joint: Secondary | ICD-10-CM | POA: Diagnosis not present

## 2024-04-14 DIAGNOSIS — E278 Other specified disorders of adrenal gland: Secondary | ICD-10-CM | POA: Diagnosis not present

## 2024-04-14 DIAGNOSIS — E559 Vitamin D deficiency, unspecified: Secondary | ICD-10-CM | POA: Diagnosis not present

## 2024-04-14 DIAGNOSIS — R947 Abnormal results of other endocrine function studies: Secondary | ICD-10-CM | POA: Diagnosis not present

## 2024-04-14 DIAGNOSIS — D513 Other dietary vitamin B12 deficiency anemia: Secondary | ICD-10-CM | POA: Diagnosis not present

## 2024-04-14 DIAGNOSIS — M25561 Pain in right knee: Secondary | ICD-10-CM | POA: Diagnosis not present

## 2024-04-23 DIAGNOSIS — Z1231 Encounter for screening mammogram for malignant neoplasm of breast: Secondary | ICD-10-CM | POA: Diagnosis not present

## 2024-04-23 DIAGNOSIS — Z6833 Body mass index (BMI) 33.0-33.9, adult: Secondary | ICD-10-CM | POA: Diagnosis not present

## 2024-04-23 DIAGNOSIS — Z01419 Encounter for gynecological examination (general) (routine) without abnormal findings: Secondary | ICD-10-CM | POA: Diagnosis not present
